# Patient Record
Sex: Female | Born: 1950 | Race: White | Hispanic: No | Marital: Single | Smoking: Never smoker
Health system: Southern US, Community
[De-identification: ages and names within clinical notes are randomized; demographics above are authoritative.]

## PROBLEM LIST (undated history)

## (undated) DIAGNOSIS — Z9889 Other specified postprocedural states: Secondary | ICD-10-CM

## (undated) DIAGNOSIS — R112 Nausea with vomiting, unspecified: Secondary | ICD-10-CM

## (undated) DIAGNOSIS — C801 Malignant (primary) neoplasm, unspecified: Secondary | ICD-10-CM

## (undated) HISTORY — PX: ABDOMINAL HYSTERECTOMY: SHX81

## (undated) HISTORY — PX: OVARIAN CYST SURGERY: SHX726

## (undated) HISTORY — DX: Nausea with vomiting, unspecified: R11.2

## (undated) HISTORY — DX: Malignant (primary) neoplasm, unspecified: C80.1

## (undated) HISTORY — PX: CHOLECYSTECTOMY: SHX55

## (undated) HISTORY — DX: Other specified postprocedural states: Z98.890

---

## 1998-01-19 ENCOUNTER — Other Ambulatory Visit: Admission: RE | Admit: 1998-01-19 | Discharge: 1998-01-19 | Payer: Self-pay | Admitting: Gynecology

## 1999-08-01 ENCOUNTER — Encounter: Payer: Self-pay | Admitting: Gynecology

## 1999-08-01 ENCOUNTER — Encounter: Admission: RE | Admit: 1999-08-01 | Discharge: 1999-08-01 | Payer: Self-pay | Admitting: Gynecology

## 2000-08-25 ENCOUNTER — Encounter: Admission: RE | Admit: 2000-08-25 | Discharge: 2000-08-25 | Payer: Self-pay | Admitting: Gynecology

## 2000-08-25 ENCOUNTER — Encounter: Payer: Self-pay | Admitting: Gynecology

## 2000-09-08 ENCOUNTER — Other Ambulatory Visit: Admission: RE | Admit: 2000-09-08 | Discharge: 2000-09-08 | Payer: Self-pay | Admitting: Gynecology

## 2001-12-10 ENCOUNTER — Encounter: Payer: Self-pay | Admitting: Gynecology

## 2001-12-10 ENCOUNTER — Encounter: Admission: RE | Admit: 2001-12-10 | Discharge: 2001-12-10 | Payer: Self-pay | Admitting: Gynecology

## 2002-12-20 ENCOUNTER — Encounter: Admission: RE | Admit: 2002-12-20 | Discharge: 2002-12-20 | Payer: Self-pay | Admitting: Gynecology

## 2002-12-20 ENCOUNTER — Encounter: Payer: Self-pay | Admitting: Gynecology

## 2002-12-22 ENCOUNTER — Other Ambulatory Visit: Admission: RE | Admit: 2002-12-22 | Discharge: 2002-12-22 | Payer: Self-pay | Admitting: Gynecology

## 2003-11-14 ENCOUNTER — Encounter: Payer: Self-pay | Admitting: Internal Medicine

## 2003-12-27 ENCOUNTER — Encounter: Admission: RE | Admit: 2003-12-27 | Discharge: 2003-12-27 | Payer: Self-pay | Admitting: Gynecology

## 2004-01-02 ENCOUNTER — Other Ambulatory Visit: Admission: RE | Admit: 2004-01-02 | Discharge: 2004-01-02 | Payer: Self-pay | Admitting: Gynecology

## 2005-01-09 ENCOUNTER — Encounter: Admission: RE | Admit: 2005-01-09 | Discharge: 2005-01-09 | Payer: Self-pay | Admitting: Gynecology

## 2005-01-15 ENCOUNTER — Other Ambulatory Visit: Admission: RE | Admit: 2005-01-15 | Discharge: 2005-01-15 | Payer: Self-pay | Admitting: Gynecology

## 2006-01-14 ENCOUNTER — Encounter: Admission: RE | Admit: 2006-01-14 | Discharge: 2006-01-14 | Payer: Self-pay | Admitting: Gynecology

## 2006-01-27 ENCOUNTER — Other Ambulatory Visit: Admission: RE | Admit: 2006-01-27 | Discharge: 2006-01-27 | Payer: Self-pay | Admitting: Gynecology

## 2007-01-21 ENCOUNTER — Encounter: Admission: RE | Admit: 2007-01-21 | Discharge: 2007-01-21 | Payer: Self-pay | Admitting: Gynecology

## 2007-01-29 ENCOUNTER — Other Ambulatory Visit: Admission: RE | Admit: 2007-01-29 | Discharge: 2007-01-29 | Payer: Self-pay | Admitting: Gynecology

## 2008-02-10 ENCOUNTER — Encounter: Admission: RE | Admit: 2008-02-10 | Discharge: 2008-02-10 | Payer: Self-pay | Admitting: Gynecology

## 2008-10-20 ENCOUNTER — Telehealth: Payer: Self-pay | Admitting: Internal Medicine

## 2008-11-22 ENCOUNTER — Ambulatory Visit: Payer: Self-pay | Admitting: Internal Medicine

## 2008-12-05 ENCOUNTER — Ambulatory Visit: Payer: Self-pay | Admitting: Internal Medicine

## 2009-02-13 ENCOUNTER — Encounter: Admission: RE | Admit: 2009-02-13 | Discharge: 2009-02-13 | Payer: Self-pay | Admitting: Gynecology

## 2010-02-20 ENCOUNTER — Encounter: Admission: RE | Admit: 2010-02-20 | Discharge: 2010-02-20 | Payer: Self-pay | Admitting: Gynecology

## 2010-11-13 NOTE — Procedures (Signed)
Summary: Colonoscopy   Colonoscopy  Procedure date:  12/05/2008  Findings:      Location:  [facility].    Procedures Next Due Date:    Colonoscopy: 12/2013  COLONOSCOPY PROCEDURE REPORT  PATIENT:  Maria Willis, Maria Willis  MR#:  [id number] BIRTHDATE:   04-Oct-1951   GENDER:   female  ENDOSCOPIST:   Hedwig Morton. Juanda Chance, MD Referred by: Beather Arbour, M.D.  PROCEDURE DATE:  12/05/2008 PROCEDURE:  Higher-risk screening colonoscopy  colonoscopy via colostomy ASA CLASS:   Class I INDICATIONS: family history of colon cancer last colon 10/2003, hx of colon cancer in pt's father  MEDICATIONS:    Versed 8 mg, Fentanyl 75 mcg  DESCRIPTION OF PROCEDURE:   After the risks benefits and alternatives of the procedure were thoroughly explained, informed consent was obtained.  Digital rectal exam was performed and revealed no rectal masses.   The LB [id number]-[id number] endoscope was introduced through the anus and advanced to the cecum, which was identified by both the appendix and ileocecal valve, without limitations.  The quality of the prep was excellent, using MiraLax.  The instrument was then slowly withdrawn as the colon was fully examined. <<PROCEDUREIMAGES>>      <<OLD IMAGES>>  FINDINGS:  A normal appearing cecum, ileocecal valve, and appendiceal orifice were identified. The ascending, transverse, descending, sigmoid colon, and rectum appeared unremarkable (see image1, image2, image3, and image4).   Retroflexed views in the rectum revealed no abnormalities.    The scope was then withdrawn from the patient and the procedure completed.  COMPLICATIONS:   None  ENDOSCOPIC IMPRESSION:  1) Normal colon  no polyps, [previously noted diverticuli not confirmed on this exam, pt has hypertrophied folds in the sigmoid colon RECOMMENDATIONS:  1) hemoccults q 2-3 years  2) high fiber diet  REPEAT EXAM:   In 5 year(s) for.   _______________________________ Hedwig Morton. Juanda Chance, MD  CC: Beather Arbour, MD

## 2010-11-13 NOTE — Miscellaneous (Signed)
Summary: [facility] Previsit/prep..  Clinical Lists Changes  Medications: Added new medication of DULCOLAX 5 MG  TBEC (BISACODYL) Day before procedure take 2 at 3pm and 2 at 8pm. - Signed Added new medication of METOCLOPRAMIDE HCL 10 MG  TABS (METOCLOPRAMIDE HCL) As per prep instructions. - Signed Added new medication of MIRALAX   POWD (POLYETHYLENE GLYCOL 3350) As per prep  instructions. - Signed Rx of DULCOLAX 5 MG  TBEC (BISACODYL) Day before procedure take 2 at 3pm and 2 at 8pm.;  #4 x 0;  Signed;  Entered by: Wyona Almas RN;  Authorized by: Hart Carwin MD;  Method used: Electronically to [facility] [address] [place], [place]  [address], Ph: [phone] or [phone], Fax: [phone] Rx of METOCLOPRAMIDE HCL 10 MG  TABS (METOCLOPRAMIDE HCL) As per prep instructions.;  #2 x 0;  Signed;  Entered by: Wyona Almas RN;  Authorized by: Hart Carwin MD;  Method used: Electronically to [facility] [address], [place], [place]  [address], Ph: [phone] or [phone], Fax: [phone] Rx of MIRALAX   POWD (POLYETHYLENE GLYCOL 3350) As per prep  instructions.;  #255gm x 0;  Signed;  Entered by: Wyona Almas RN;  Authorized by: Hart Carwin MD;  Method used: Electronically to [facility] [address], [place], [place]  [address], Ph: [phone] or [phone], Fax: [phone] Allergies: Added new allergy or adverse reaction of SULFA Observations: Added new observation of NKA: F (11/22/2008 7:57)    Prescriptions: MIRALAX   POWD (POLYETHYLENE GLYCOL 3350) As per prep  instructions.  #255gm x 0   Entered by:   Wyona Almas RN   Authorized by:   Hart Carwin MD   Signed by:   Wyona Almas RN on 11/22/2008   Method used:   Electronically to        [facility] (retail)       [address]       [place], [place]  [address]       Ph: [phone] or [phone]       Fax: [phone]   RxID:   [id number] METOCLOPRAMIDE HCL 10 MG  TABS (METOCLOPRAMIDE  HCL) As per prep instructions.  #2 x 0   Entered by:   Wyona Almas RN   Authorized by:   Hart Carwin MD   Signed by:   Wyona Almas RN on 11/22/2008   Method used:   Electronically to        [facility] (retail)       [address]       [place], [place]  [address]       Ph: [phone] or [phone]       Fax: [phone]   RxID:   [id number] DULCOLAX 5 MG  TBEC (BISACODYL) Day before procedure take 2 at 3pm and 2 at 8pm.  #4 x 0   Entered by:   Wyona Almas RN   Authorized by:   Hart Carwin MD   Signed by:   Wyona Almas RN on 11/22/2008   Method used:   Electronically to        [facility] (retail)       [address]       [place], [place]  [address]       Ph: [phone] or [phone]       Fax: [phone]   RxID:   [id number]

## 2010-11-13 NOTE — Procedures (Signed)
Summary: Colonoscopy   Colonoscopy  Procedure date:  11/14/2003  Findings:      Location:  [facility].     Patient Name: Maria Willis, Maria Willis MRN:  Procedure Procedures: Colonoscopy CPT: [phone].  Personnel: Endoscopist: Mansfield Dann L. Juanda Chance, MD.  Referred By: Beather Arbour, MD.  Exam Location: Exam performed in Outpatient Clinic. Outpatient  Patient Consent: Procedure, Alternatives, Risks and Benefits discussed, consent obtained, from patient. Consent was obtained by the RN.  Indications  Average Risk Screening Routine.  History  Current Medications: Patient is not currently taking Coumadin.  Pre-Exam Physical: Performed Nov 14, 2003. Cardio-pulmonary exam, Rectal exam, HEENT exam , Abdominal exam, Extremity exam, Neurological exam, Mental status exam WNL.  Exam Exam: Extent of exam reached: Cecum, extent intended: Cecum.  The cecum was identified by appendiceal orifice and IC valve. Colon retroflexion performed. Images taken. ASA Classification: I. Tolerance: good.  Monitoring: Pulse and BP monitoring, Oximetry used. Supplemental O2 given.  Colon Prep Used Golytely for colon prep. Prep results: good.  Sedation Meds: Patient assessed and found to be appropriate for moderate (conscious) sedation. Fentanyl 100 mcg. given IV. Versed 7 mg. given IV.  Findings - DIVERTICULOSIS: Sigmoid Colon. ICD9: Diverticulosis: 562.10. Comments: minimal, only few diverticuli noted.   Assessment Normal examination.  Diagnoses: 562.10: Diverticulosis.   Comments: no polyps Events  Unplanned Interventions: No intervention was required.  Unplanned Events: There were no complications. Plans Patient Education: Patient given standard instructions for: Yearly hemoccult testing recommended. Patient instructed to get routine colonoscopy every 10 years.  Comments: follow up with Dr Nicholas Lose Disposition: After procedure patient sent to recovery. After recovery patient  sent home.    CC: Leonette Most Lomax,MD  This report was created from the original endoscopy report, which was reviewed and signed by the above listed endoscopist.

## 2010-11-13 NOTE — Progress Notes (Signed)
Summary: Recall colonoscopy scheduled  Phone Note Call from Patient Call back at [phone]   Caller: Patient Call For: BRODIE  Reason for Call: Talk to Nurse Details for Reason: ? recall  Summary of Call: recall due in 10-2013 pt is wondering if she has to wait that long since her father was DX with colorectal CA since pt's last Colonoscopy Initial call taken by: Guadlupe Spanish [facility],  October 20, 2008 1:33 PM  Follow-up for Phone Call        Pt. father was DX. with Colon/Rectal cancer 2 years ago, he is 43.  Pt. is symptom free and she does do hemoccult cards every year w/Dr.Lomax, the are always negative. She wants to know if she should wait till her recall date in 2015 or should she have a sooner colonoscopy? DR.BRODIE PLEASE ADVISE Follow-up by: Laureen Ochs LPN,  October 20, 2008 4:07 PM  Additional Follow-up for Phone Call Additional follow up Details #1::        chart reviewed. Last colon 11/14/03, normal exam, there were no risk factors at that time. Since her father has since been diagnosed with colon Cancer, her recall should be changed to 5 years, so  she ought to have a recall colonoscopy this month 1/10 Additional Follow-up by: Hart Carwin MD,  October 20, 2008 11:40 PM  New Problems: CARCINOMA, COLON, FAMILY HX (ICD-V16.0)   Additional Follow-up for Phone Call Additional follow up Details #2::    Above MD instructions reviewed w/pt. She needs to schedule Colonoscopy for early on a Monday morning, but not 11/28/08. I will callback when the remainder of Dr.Brodie's schedule gets put in the computer, to schedule w/pt. Pt. instructed to call back as needed.  Follow-up by: Laureen Ochs LPN,  October 21, 2008 9:13 AM  Additional Follow-up for Phone Call Additional follow up Details #3:: Details for Additional Follow-up Action Taken: Message left for patient: Pre visit scheduled for 11-25-2008 at 9am and Colonoscopy is scheduled at [facility] on 12-05-08 at 8:30am. Information also  mailed to patient. Pt. advised to callback if she needs to reschedule appointments. Pt. instructed to call back as needed.  Additional Follow-up by: Laureen Ochs LPN,  October 24, 2008 2:07 PM  New Problems: CARCINOMA, COLON, FAMILY HX (ICD-V16.0)

## 2011-03-28 ENCOUNTER — Other Ambulatory Visit: Payer: Self-pay | Admitting: Gynecology

## 2011-03-28 DIAGNOSIS — Z1231 Encounter for screening mammogram for malignant neoplasm of breast: Secondary | ICD-10-CM

## 2011-04-02 ENCOUNTER — Ambulatory Visit: Payer: Self-pay

## 2011-04-10 ENCOUNTER — Ambulatory Visit
Admission: RE | Admit: 2011-04-10 | Discharge: 2011-04-10 | Disposition: A | Discharge status: Home / Self Care | Payer: PRIVATE HEALTH INSURANCE | Source: Ambulatory Visit | Attending: Gynecology | Admitting: Gynecology

## 2011-04-10 DIAGNOSIS — Z1231 Encounter for screening mammogram for malignant neoplasm of breast: Secondary | ICD-10-CM

## 2012-03-03 ENCOUNTER — Other Ambulatory Visit: Payer: Self-pay | Admitting: Gynecology

## 2012-03-03 DIAGNOSIS — Z1231 Encounter for screening mammogram for malignant neoplasm of breast: Secondary | ICD-10-CM

## 2012-04-10 ENCOUNTER — Ambulatory Visit
Admission: RE | Admit: 2012-04-10 | Discharge: 2012-04-10 | Disposition: A | Discharge status: Home / Self Care | Payer: PRIVATE HEALTH INSURANCE | Source: Ambulatory Visit | Attending: Gynecology | Admitting: Gynecology

## 2012-04-10 DIAGNOSIS — Z1231 Encounter for screening mammogram for malignant neoplasm of breast: Secondary | ICD-10-CM

## 2013-06-24 ENCOUNTER — Other Ambulatory Visit: Payer: Self-pay

## 2013-06-24 DIAGNOSIS — Z1231 Encounter for screening mammogram for malignant neoplasm of breast: Secondary | ICD-10-CM

## 2013-07-15 ENCOUNTER — Ambulatory Visit: Payer: PRIVATE HEALTH INSURANCE

## 2013-08-02 ENCOUNTER — Ambulatory Visit
Admission: RE | Admit: 2013-08-02 | Discharge: 2013-08-02 | Disposition: A | Discharge status: Home / Self Care | Payer: PRIVATE HEALTH INSURANCE | Source: Ambulatory Visit

## 2013-08-02 DIAGNOSIS — Z1231 Encounter for screening mammogram for malignant neoplasm of breast: Secondary | ICD-10-CM

## 2013-08-03 ENCOUNTER — Ambulatory Visit: Payer: PRIVATE HEALTH INSURANCE

## 2013-09-24 ENCOUNTER — Encounter: Payer: Self-pay | Admitting: Internal Medicine

## 2013-09-29 ENCOUNTER — Telehealth: Payer: Self-pay | Admitting: Internal Medicine

## 2013-09-29 NOTE — Telephone Encounter (Signed)
Left a message for patient to call me. 

## 2013-10-01 NOTE — Telephone Encounter (Signed)
Spoke with patient and told her due to her father having colon cancer, her recall is 5 years. She is worried about the cost. Discussed importance of having the procedure. Discussed possible payment plan. She will think about this.

## 2014-04-27 ENCOUNTER — Encounter: Payer: Self-pay | Admitting: Internal Medicine

## 2014-07-26 ENCOUNTER — Other Ambulatory Visit: Payer: Self-pay

## 2014-07-26 DIAGNOSIS — Z1239 Encounter for other screening for malignant neoplasm of breast: Secondary | ICD-10-CM

## 2014-08-17 ENCOUNTER — Ambulatory Visit
Admission: RE | Admit: 2014-08-17 | Discharge: 2014-08-17 | Disposition: A | Discharge status: Home / Self Care | Payer: No Typology Code available for payment source | Source: Ambulatory Visit

## 2014-08-17 ENCOUNTER — Other Ambulatory Visit: Payer: Self-pay

## 2014-08-17 DIAGNOSIS — Z1231 Encounter for screening mammogram for malignant neoplasm of breast: Secondary | ICD-10-CM

## 2015-08-14 ENCOUNTER — Other Ambulatory Visit: Payer: Self-pay

## 2015-08-14 DIAGNOSIS — Z1231 Encounter for screening mammogram for malignant neoplasm of breast: Secondary | ICD-10-CM

## 2015-08-29 ENCOUNTER — Ambulatory Visit
Admission: RE | Admit: 2015-08-29 | Discharge: 2015-08-29 | Disposition: A | Discharge status: Home / Self Care | Payer: 59 | Source: Ambulatory Visit

## 2015-08-29 DIAGNOSIS — Z1231 Encounter for screening mammogram for malignant neoplasm of breast: Secondary | ICD-10-CM

## 2016-07-29 ENCOUNTER — Other Ambulatory Visit: Payer: Self-pay | Admitting: Physician Assistant

## 2016-07-29 DIAGNOSIS — Z1231 Encounter for screening mammogram for malignant neoplasm of breast: Secondary | ICD-10-CM

## 2016-08-29 ENCOUNTER — Ambulatory Visit
Admission: RE | Admit: 2016-08-29 | Discharge: 2016-08-29 | Disposition: A | Discharge status: Home / Self Care | Payer: 59 | Source: Ambulatory Visit | Attending: Physician Assistant | Admitting: Physician Assistant

## 2016-08-29 DIAGNOSIS — Z1231 Encounter for screening mammogram for malignant neoplasm of breast: Secondary | ICD-10-CM

## 2017-01-20 DIAGNOSIS — H6123 Impacted cerumen, bilateral: Secondary | ICD-10-CM | POA: Diagnosis not present

## 2017-01-23 DIAGNOSIS — M25561 Pain in right knee: Secondary | ICD-10-CM | POA: Diagnosis not present

## 2017-01-23 DIAGNOSIS — M25521 Pain in right elbow: Secondary | ICD-10-CM | POA: Diagnosis not present

## 2017-06-13 DIAGNOSIS — L821 Other seborrheic keratosis: Secondary | ICD-10-CM | POA: Diagnosis not present

## 2017-06-13 DIAGNOSIS — Z85828 Personal history of other malignant neoplasm of skin: Secondary | ICD-10-CM | POA: Diagnosis not present

## 2017-08-21 ENCOUNTER — Other Ambulatory Visit: Payer: Self-pay | Admitting: Physician Assistant

## 2017-08-21 DIAGNOSIS — Z139 Encounter for screening, unspecified: Secondary | ICD-10-CM

## 2017-09-17 ENCOUNTER — Ambulatory Visit
Admission: RE | Admit: 2017-09-17 | Discharge: 2017-09-17 | Disposition: A | Discharge status: Home / Self Care | Payer: Medicare Other | Source: Ambulatory Visit | Attending: Physician Assistant | Admitting: Physician Assistant

## 2017-09-17 DIAGNOSIS — Z1231 Encounter for screening mammogram for malignant neoplasm of breast: Secondary | ICD-10-CM | POA: Diagnosis not present

## 2017-09-17 DIAGNOSIS — Z139 Encounter for screening, unspecified: Secondary | ICD-10-CM

## 2017-10-01 DIAGNOSIS — H6123 Impacted cerumen, bilateral: Secondary | ICD-10-CM | POA: Diagnosis not present

## 2017-10-01 DIAGNOSIS — Z23 Encounter for immunization: Secondary | ICD-10-CM | POA: Diagnosis not present

## 2017-10-14 HISTORY — PX: MENISCUS REPAIR: SHX5179

## 2017-10-30 DIAGNOSIS — S83241A Other tear of medial meniscus, current injury, right knee, initial encounter: Secondary | ICD-10-CM | POA: Diagnosis not present

## 2017-11-08 DIAGNOSIS — M25561 Pain in right knee: Secondary | ICD-10-CM | POA: Diagnosis not present

## 2017-12-10 DIAGNOSIS — G8918 Other acute postprocedural pain: Secondary | ICD-10-CM | POA: Diagnosis not present

## 2017-12-10 DIAGNOSIS — S83241A Other tear of medial meniscus, current injury, right knee, initial encounter: Secondary | ICD-10-CM | POA: Diagnosis not present

## 2017-12-10 DIAGNOSIS — M23251 Derangement of posterior horn of lateral meniscus due to old tear or injury, right knee: Secondary | ICD-10-CM | POA: Diagnosis not present

## 2017-12-10 DIAGNOSIS — M94261 Chondromalacia, right knee: Secondary | ICD-10-CM | POA: Diagnosis not present

## 2017-12-10 DIAGNOSIS — M23221 Derangement of posterior horn of medial meniscus due to old tear or injury, right knee: Secondary | ICD-10-CM | POA: Diagnosis not present

## 2017-12-10 DIAGNOSIS — S83281A Other tear of lateral meniscus, current injury, right knee, initial encounter: Secondary | ICD-10-CM | POA: Diagnosis not present

## 2017-12-16 DIAGNOSIS — S83241D Other tear of medial meniscus, current injury, right knee, subsequent encounter: Secondary | ICD-10-CM | POA: Diagnosis not present

## 2018-01-15 DIAGNOSIS — S83241D Other tear of medial meniscus, current injury, right knee, subsequent encounter: Secondary | ICD-10-CM | POA: Diagnosis not present

## 2018-01-29 DIAGNOSIS — D225 Melanocytic nevi of trunk: Secondary | ICD-10-CM | POA: Diagnosis not present

## 2018-01-29 DIAGNOSIS — L570 Actinic keratosis: Secondary | ICD-10-CM | POA: Diagnosis not present

## 2018-01-29 DIAGNOSIS — D1801 Hemangioma of skin and subcutaneous tissue: Secondary | ICD-10-CM | POA: Diagnosis not present

## 2018-01-29 DIAGNOSIS — Z85828 Personal history of other malignant neoplasm of skin: Secondary | ICD-10-CM | POA: Diagnosis not present

## 2018-01-29 DIAGNOSIS — D2271 Melanocytic nevi of right lower limb, including hip: Secondary | ICD-10-CM | POA: Diagnosis not present

## 2018-01-29 DIAGNOSIS — L821 Other seborrheic keratosis: Secondary | ICD-10-CM | POA: Diagnosis not present

## 2018-01-29 DIAGNOSIS — L918 Other hypertrophic disorders of the skin: Secondary | ICD-10-CM | POA: Diagnosis not present

## 2018-01-29 DIAGNOSIS — D2272 Melanocytic nevi of left lower limb, including hip: Secondary | ICD-10-CM | POA: Diagnosis not present

## 2018-01-29 DIAGNOSIS — L814 Other melanin hyperpigmentation: Secondary | ICD-10-CM | POA: Diagnosis not present

## 2018-03-16 DIAGNOSIS — S83241D Other tear of medial meniscus, current injury, right knee, subsequent encounter: Secondary | ICD-10-CM | POA: Diagnosis not present

## 2018-03-16 DIAGNOSIS — S83281D Other tear of lateral meniscus, current injury, right knee, subsequent encounter: Secondary | ICD-10-CM | POA: Diagnosis not present

## 2018-03-16 DIAGNOSIS — M25561 Pain in right knee: Secondary | ICD-10-CM | POA: Diagnosis not present

## 2018-03-24 DIAGNOSIS — S83241D Other tear of medial meniscus, current injury, right knee, subsequent encounter: Secondary | ICD-10-CM | POA: Diagnosis not present

## 2018-03-24 DIAGNOSIS — M25561 Pain in right knee: Secondary | ICD-10-CM | POA: Diagnosis not present

## 2018-03-24 DIAGNOSIS — S83281D Other tear of lateral meniscus, current injury, right knee, subsequent encounter: Secondary | ICD-10-CM | POA: Diagnosis not present

## 2018-04-28 DIAGNOSIS — Z1159 Encounter for screening for other viral diseases: Secondary | ICD-10-CM | POA: Diagnosis not present

## 2018-04-28 DIAGNOSIS — E7800 Pure hypercholesterolemia, unspecified: Secondary | ICD-10-CM | POA: Diagnosis not present

## 2018-04-28 DIAGNOSIS — Z0000 Encounter for general adult medical examination without abnormal findings: Secondary | ICD-10-CM | POA: Diagnosis not present

## 2018-04-28 DIAGNOSIS — Z23 Encounter for immunization: Secondary | ICD-10-CM | POA: Diagnosis not present

## 2018-04-28 DIAGNOSIS — M8588 Other specified disorders of bone density and structure, other site: Secondary | ICD-10-CM | POA: Diagnosis not present

## 2018-04-30 DIAGNOSIS — Z0000 Encounter for general adult medical examination without abnormal findings: Secondary | ICD-10-CM | POA: Diagnosis not present

## 2018-04-30 DIAGNOSIS — Z1159 Encounter for screening for other viral diseases: Secondary | ICD-10-CM | POA: Diagnosis not present

## 2018-04-30 DIAGNOSIS — E7800 Pure hypercholesterolemia, unspecified: Secondary | ICD-10-CM | POA: Diagnosis not present

## 2018-04-30 DIAGNOSIS — M8588 Other specified disorders of bone density and structure, other site: Secondary | ICD-10-CM | POA: Diagnosis not present

## 2018-05-25 DIAGNOSIS — R634 Abnormal weight loss: Secondary | ICD-10-CM | POA: Diagnosis not present

## 2018-05-25 DIAGNOSIS — Z0189 Encounter for other specified special examinations: Secondary | ICD-10-CM | POA: Diagnosis not present

## 2018-05-25 DIAGNOSIS — E7800 Pure hypercholesterolemia, unspecified: Secondary | ICD-10-CM | POA: Diagnosis not present

## 2018-05-25 DIAGNOSIS — E559 Vitamin D deficiency, unspecified: Secondary | ICD-10-CM | POA: Diagnosis not present

## 2018-05-28 DIAGNOSIS — R634 Abnormal weight loss: Secondary | ICD-10-CM | POA: Diagnosis not present

## 2018-08-09 DIAGNOSIS — Z23 Encounter for immunization: Secondary | ICD-10-CM | POA: Diagnosis not present

## 2018-08-10 ENCOUNTER — Other Ambulatory Visit: Payer: Self-pay | Admitting: Physician Assistant

## 2018-08-10 ENCOUNTER — Other Ambulatory Visit: Payer: Self-pay | Admitting: Family Medicine

## 2018-08-10 DIAGNOSIS — Z1231 Encounter for screening mammogram for malignant neoplasm of breast: Secondary | ICD-10-CM

## 2018-08-17 DIAGNOSIS — M8588 Other specified disorders of bone density and structure, other site: Secondary | ICD-10-CM | POA: Diagnosis not present

## 2018-08-31 DIAGNOSIS — J019 Acute sinusitis, unspecified: Secondary | ICD-10-CM | POA: Diagnosis not present

## 2018-08-31 DIAGNOSIS — H6123 Impacted cerumen, bilateral: Secondary | ICD-10-CM | POA: Diagnosis not present

## 2018-09-14 DIAGNOSIS — H6122 Impacted cerumen, left ear: Secondary | ICD-10-CM | POA: Diagnosis not present

## 2018-09-18 ENCOUNTER — Ambulatory Visit
Admission: RE | Admit: 2018-09-18 | Discharge: 2018-09-18 | Disposition: A | Discharge status: Home / Self Care | Payer: Medicare Other | Source: Ambulatory Visit | Attending: Family Medicine | Admitting: Family Medicine

## 2018-09-18 DIAGNOSIS — Z1231 Encounter for screening mammogram for malignant neoplasm of breast: Secondary | ICD-10-CM | POA: Diagnosis not present

## 2018-10-27 ENCOUNTER — Encounter: Payer: Self-pay | Admitting: Gastroenterology

## 2018-11-23 ENCOUNTER — Ambulatory Visit (AMBULATORY_SURGERY_CENTER): Payer: Self-pay | Admitting: *Deleted

## 2018-11-23 VITALS — Ht 64.0 in | Wt 127.0 lb

## 2018-11-23 DIAGNOSIS — Z1211 Encounter for screening for malignant neoplasm of colon: Secondary | ICD-10-CM

## 2018-11-23 MED ORDER — PEG-KCL-NACL-NASULF-NA ASC-C 140 G PO SOLR: 1.0000 | Freq: Once | ORAL | 0 refills | Status: AC

## 2018-11-23 NOTE — Progress Notes (Signed)
Patient denies any allergies to eggs or soy. Patient denies any problems with anesthesia/sedation. Patient denies any oxygen use at home. Patient denies taking any diet/weight loss medications or blood thinners. EMMI education offered, pt declined. Patient denies any family hx colon cancer.  plenvu sample given to pt.

## 2018-12-07 ENCOUNTER — Ambulatory Visit (AMBULATORY_SURGERY_CENTER): Payer: Medicare Other | Admitting: Gastroenterology

## 2018-12-07 ENCOUNTER — Encounter: Payer: Self-pay | Admitting: Gastroenterology

## 2018-12-07 VITALS — BP 107/67 | HR 73 | Temp 96.4°F | Resp 15 | Ht 64.0 in | Wt 127.0 lb

## 2018-12-07 DIAGNOSIS — Z1211 Encounter for screening for malignant neoplasm of colon: Secondary | ICD-10-CM | POA: Diagnosis not present

## 2018-12-07 MED ORDER — SODIUM CHLORIDE 0.9 % IV SOLN: 500.0000 mL | INTRAVENOUS | Status: AC

## 2018-12-07 NOTE — Op Note (Signed)
[place] Patient Name: Maria Willis Procedure Date: 12/07/2018 8:45 AM MRN: [id number] Endoscopist: Mallie Mussel L. Loletha Carrow , MD Age: 68 Referring MD:  Date of Birth: 1951/02/14 Gender: Female Account #: [id number] Procedure:                Colonoscopy Indications:              Screening for colorectal malignant neoplasm (no                            polyps 11/2008) Medicines:                Monitored Anesthesia Care Procedure:                Pre-Anesthesia Assessment:                           - Prior to the procedure, a History and Physical                            was performed, and patient medications and                            allergies were reviewed. The patient's tolerance of                            previous anesthesia was also reviewed. The risks                            and benefits of the procedure and the sedation                            options and risks were discussed with the patient.                            All questions were answered, and informed consent                            was obtained. Prior Anticoagulants: The patient has                            taken no previous anticoagulant or antiplatelet                            agents. ASA Grade Assessment: II - A patient with                            mild systemic disease. After reviewing the risks                            and benefits, the patient was deemed in                            satisfactory condition to undergo the procedure.  After obtaining informed consent, the colonoscope                            was passed under direct vision. Throughout the                            procedure, the patient's blood pressure, pulse, and                            oxygen saturations were monitored continuously. The                            Colonoscope was introduced through the anus and                            advanced to the the cecum, identified by                   appendiceal orifice and ileocecal valve. The                            colonoscopy was performed without difficulty. The                            patient tolerated the procedure well. The quality                            of the bowel preparation was excellent. The                            ileocecal valve, appendiceal orifice, and rectum                            were photographed. Scope In: 8:50:40 AM Scope Out: 9:04:55 AM Scope Withdrawal Time: 0 hours 8 minutes 43 seconds  Total Procedure Duration: 0 hours 14 minutes 15 seconds  Findings:                 The perianal and digital rectal examinations were                            normal.                           The entire examined colon appeared normal on direct                            and retroflexion views. Complications:            No immediate complications. Estimated Blood Loss:     Estimated blood loss: none. Impression:               - The entire examined colon is normal on direct and                            retroflexion views.                           -  No specimens collected. Recommendation:           - Patient has a contact number available for                            emergencies. The signs and symptoms of potential                            delayed complications were discussed with the                            patient. Return to normal activities tomorrow.                            Written discharge instructions were provided to the                            patient.                           - Resume previous diet.                           - Continue present medications.                           - No repeat screening colonoscopy due to current                            age (59 years or older) and the absence of colonic                            polyps. Maria Willis L. Loletha Carrow, MD 12/07/2018 9:07:28 AM This report has been signed electronically.

## 2018-12-07 NOTE — Progress Notes (Signed)
Pt's states no medical or surgical changes since previsit or office visit. 

## 2018-12-07 NOTE — Patient Instructions (Signed)
Impression/Recommendations:  Resume previous diet. Continue present medications.  No repeat screening colonoscopy needed.  YOU HAD AN ENDOSCOPIC PROCEDURE TODAY AT [facility]:   Refer to the procedure report that was given to you for any specific questions about what was found during the examination.  If the procedure report does not answer your questions, please call your gastroenterologist to clarify.  If you requested that your care partner not be given the details of your procedure findings, then the procedure report has been included in a sealed envelope for you to review at your convenience later.  YOU SHOULD EXPECT: Some feelings of bloating in the abdomen. Passage of more gas than usual.  Walking can help get rid of the air that was put into your GI tract during the procedure and reduce the bloating. If you had a lower endoscopy (such as a colonoscopy or flexible sigmoidoscopy) you may notice spotting of blood in your stool or on the toilet paper. If you underwent a bowel prep for your procedure, you may not have a normal bowel movement for a few days.  Please Note:  You might notice some irritation and congestion in your nose or some drainage.  This is from the oxygen used during your procedure.  There is no need for concern and it should clear up in a day or so.  SYMPTOMS TO REPORT IMMEDIATELY:   Following lower endoscopy (colonoscopy or flexible sigmoidoscopy):  Excessive amounts of blood in the stool  Significant tenderness or worsening of abdominal pains  Swelling of the abdomen that is new, acute  Fever of 100F or higher  For urgent or emergent issues, a gastroenterologist can be reached at any hour by calling [phone].   DIET:  We do recommend a small meal at first, but then you may proceed to your regular diet.  Drink plenty of fluids but you should avoid alcoholic beverages for 24 hours.  ACTIVITY:  You should plan to take it easy for the rest of  today and you should NOT DRIVE or use heavy machinery until tomorrow (because of the sedation medicines used during the test).    FOLLOW UP: Our staff will call the number listed on your records the next business day following your procedure to check on you and address any questions or concerns that you may have regarding the information given to you following your procedure. If we do not reach you, we will leave a message.  However, if you are feeling well and you are not experiencing any problems, there is no need to return our call.  We will assume that you have returned to your regular daily activities without incident.  If any biopsies were taken you will be contacted by phone or by letter within the next 1-3 weeks.  Please call us at [phone] if you have not heard about the biopsies in 3 weeks.    SIGNATURES/CONFIDENTIALITY: You and/or your care partner have signed paperwork which will be entered into your electronic medical record.  These signatures attest to the fact that that the information above on your After Visit Summary has been reviewed and is understood.  Full responsibility of the confidentiality of this discharge information lies with you and/or your care-partner.

## 2018-12-07 NOTE — Progress Notes (Signed)
Report given to PACU, vss 

## 2018-12-08 ENCOUNTER — Telehealth: Payer: Self-pay

## 2018-12-08 NOTE — Telephone Encounter (Signed)
  Follow up Call-  Call back number 12/07/2018  Post procedure Call Back phone  # [phone]  Permission to leave phone message Yes  Some recent data might be hidden     Patient questions:  Do you have a fever, pain , or abdominal swelling? No. Pain Score  0 *  Have you tolerated food without any problems? Yes.    Have you been able to return to your normal activities? Yes.    Do you have any questions about your discharge instructions: Diet   No. Medications  No. Follow up visit  No.  Do you have questions or concerns about your Care? No.  Actions: * If pain score is 4 or above: No action needed, pain <4.

## 2019-01-10 DIAGNOSIS — H6123 Impacted cerumen, bilateral: Secondary | ICD-10-CM | POA: Diagnosis not present

## 2019-03-09 DIAGNOSIS — Z20828 Contact with and (suspected) exposure to other viral communicable diseases: Secondary | ICD-10-CM | POA: Diagnosis not present

## 2019-04-06 DIAGNOSIS — L918 Other hypertrophic disorders of the skin: Secondary | ICD-10-CM | POA: Diagnosis not present

## 2019-04-06 DIAGNOSIS — L239 Allergic contact dermatitis, unspecified cause: Secondary | ICD-10-CM | POA: Diagnosis not present

## 2019-04-06 DIAGNOSIS — D692 Other nonthrombocytopenic purpura: Secondary | ICD-10-CM | POA: Diagnosis not present

## 2019-04-06 DIAGNOSIS — D2272 Melanocytic nevi of left lower limb, including hip: Secondary | ICD-10-CM | POA: Diagnosis not present

## 2019-04-06 DIAGNOSIS — D2261 Melanocytic nevi of right upper limb, including shoulder: Secondary | ICD-10-CM | POA: Diagnosis not present

## 2019-04-06 DIAGNOSIS — D485 Neoplasm of uncertain behavior of skin: Secondary | ICD-10-CM | POA: Diagnosis not present

## 2019-04-06 DIAGNOSIS — L814 Other melanin hyperpigmentation: Secondary | ICD-10-CM | POA: Diagnosis not present

## 2019-04-06 DIAGNOSIS — D225 Melanocytic nevi of trunk: Secondary | ICD-10-CM | POA: Diagnosis not present

## 2019-04-06 DIAGNOSIS — L821 Other seborrheic keratosis: Secondary | ICD-10-CM | POA: Diagnosis not present

## 2019-04-06 DIAGNOSIS — D1801 Hemangioma of skin and subcutaneous tissue: Secondary | ICD-10-CM | POA: Diagnosis not present

## 2019-04-06 DIAGNOSIS — L570 Actinic keratosis: Secondary | ICD-10-CM | POA: Diagnosis not present

## 2019-04-06 DIAGNOSIS — Z85828 Personal history of other malignant neoplasm of skin: Secondary | ICD-10-CM | POA: Diagnosis not present

## 2019-06-29 DIAGNOSIS — L570 Actinic keratosis: Secondary | ICD-10-CM | POA: Diagnosis not present

## 2019-06-29 DIAGNOSIS — Z85828 Personal history of other malignant neoplasm of skin: Secondary | ICD-10-CM | POA: Diagnosis not present

## 2019-06-29 DIAGNOSIS — C44519 Basal cell carcinoma of skin of other part of trunk: Secondary | ICD-10-CM | POA: Diagnosis not present

## 2019-06-29 DIAGNOSIS — D485 Neoplasm of uncertain behavior of skin: Secondary | ICD-10-CM | POA: Diagnosis not present

## 2019-06-29 DIAGNOSIS — L821 Other seborrheic keratosis: Secondary | ICD-10-CM | POA: Diagnosis not present

## 2019-06-29 DIAGNOSIS — D1801 Hemangioma of skin and subcutaneous tissue: Secondary | ICD-10-CM | POA: Diagnosis not present

## 2019-06-29 DIAGNOSIS — L820 Inflamed seborrheic keratosis: Secondary | ICD-10-CM | POA: Diagnosis not present

## 2019-07-12 DIAGNOSIS — H6123 Impacted cerumen, bilateral: Secondary | ICD-10-CM | POA: Diagnosis not present

## 2019-07-12 DIAGNOSIS — Z23 Encounter for immunization: Secondary | ICD-10-CM | POA: Diagnosis not present

## 2019-07-19 DIAGNOSIS — E7800 Pure hypercholesterolemia, unspecified: Secondary | ICD-10-CM | POA: Diagnosis not present

## 2019-07-19 DIAGNOSIS — Z23 Encounter for immunization: Secondary | ICD-10-CM | POA: Diagnosis not present

## 2019-07-19 DIAGNOSIS — Z131 Encounter for screening for diabetes mellitus: Secondary | ICD-10-CM | POA: Diagnosis not present

## 2019-07-19 DIAGNOSIS — Z0000 Encounter for general adult medical examination without abnormal findings: Secondary | ICD-10-CM | POA: Diagnosis not present

## 2019-08-27 ENCOUNTER — Other Ambulatory Visit: Payer: Self-pay | Admitting: Family Medicine

## 2019-08-27 DIAGNOSIS — Z1231 Encounter for screening mammogram for malignant neoplasm of breast: Secondary | ICD-10-CM

## 2019-10-21 ENCOUNTER — Ambulatory Visit
Admission: RE | Admit: 2019-10-21 | Discharge: 2019-10-21 | Disposition: A | Discharge status: Home / Self Care | Payer: Medicare Other | Source: Ambulatory Visit | Attending: Family Medicine | Admitting: Family Medicine

## 2019-10-21 ENCOUNTER — Other Ambulatory Visit: Payer: Self-pay

## 2019-10-21 DIAGNOSIS — Z1231 Encounter for screening mammogram for malignant neoplasm of breast: Secondary | ICD-10-CM

## 2019-11-08 DIAGNOSIS — H52203 Unspecified astigmatism, bilateral: Secondary | ICD-10-CM | POA: Diagnosis not present

## 2019-11-08 DIAGNOSIS — H2513 Age-related nuclear cataract, bilateral: Secondary | ICD-10-CM | POA: Diagnosis not present

## 2019-11-26 DIAGNOSIS — Z23 Encounter for immunization: Secondary | ICD-10-CM | POA: Diagnosis not present

## 2019-12-24 DIAGNOSIS — Z23 Encounter for immunization: Secondary | ICD-10-CM | POA: Diagnosis not present

## 2020-06-29 DIAGNOSIS — D225 Melanocytic nevi of trunk: Secondary | ICD-10-CM | POA: Diagnosis not present

## 2020-06-29 DIAGNOSIS — L570 Actinic keratosis: Secondary | ICD-10-CM | POA: Diagnosis not present

## 2020-06-29 DIAGNOSIS — D1801 Hemangioma of skin and subcutaneous tissue: Secondary | ICD-10-CM | POA: Diagnosis not present

## 2020-06-29 DIAGNOSIS — D485 Neoplasm of uncertain behavior of skin: Secondary | ICD-10-CM | POA: Diagnosis not present

## 2020-06-29 DIAGNOSIS — D2272 Melanocytic nevi of left lower limb, including hip: Secondary | ICD-10-CM | POA: Diagnosis not present

## 2020-06-29 DIAGNOSIS — L821 Other seborrheic keratosis: Secondary | ICD-10-CM | POA: Diagnosis not present

## 2020-06-29 DIAGNOSIS — L237 Allergic contact dermatitis due to plants, except food: Secondary | ICD-10-CM | POA: Diagnosis not present

## 2020-06-29 DIAGNOSIS — L814 Other melanin hyperpigmentation: Secondary | ICD-10-CM | POA: Diagnosis not present

## 2020-06-29 DIAGNOSIS — Z85828 Personal history of other malignant neoplasm of skin: Secondary | ICD-10-CM | POA: Diagnosis not present

## 2020-06-29 DIAGNOSIS — C44311 Basal cell carcinoma of skin of nose: Secondary | ICD-10-CM | POA: Diagnosis not present

## 2020-06-29 DIAGNOSIS — D2262 Melanocytic nevi of left upper limb, including shoulder: Secondary | ICD-10-CM | POA: Diagnosis not present

## 2020-06-29 DIAGNOSIS — D2261 Melanocytic nevi of right upper limb, including shoulder: Secondary | ICD-10-CM | POA: Diagnosis not present

## 2020-07-06 ENCOUNTER — Encounter: Payer: Self-pay | Admitting: Gastroenterology

## 2020-07-15 DIAGNOSIS — H6123 Impacted cerumen, bilateral: Secondary | ICD-10-CM | POA: Diagnosis not present

## 2020-07-15 DIAGNOSIS — Z23 Encounter for immunization: Secondary | ICD-10-CM | POA: Diagnosis not present

## 2020-07-25 ENCOUNTER — Other Ambulatory Visit: Payer: Self-pay | Admitting: Family Medicine

## 2020-07-25 DIAGNOSIS — M8580 Other specified disorders of bone density and structure, unspecified site: Secondary | ICD-10-CM | POA: Diagnosis not present

## 2020-07-25 DIAGNOSIS — Z1231 Encounter for screening mammogram for malignant neoplasm of breast: Secondary | ICD-10-CM

## 2020-07-25 DIAGNOSIS — E559 Vitamin D deficiency, unspecified: Secondary | ICD-10-CM | POA: Diagnosis not present

## 2020-07-25 DIAGNOSIS — Z0000 Encounter for general adult medical examination without abnormal findings: Secondary | ICD-10-CM | POA: Diagnosis not present

## 2020-07-25 DIAGNOSIS — R7989 Other specified abnormal findings of blood chemistry: Secondary | ICD-10-CM | POA: Diagnosis not present

## 2020-07-25 DIAGNOSIS — E7800 Pure hypercholesterolemia, unspecified: Secondary | ICD-10-CM | POA: Diagnosis not present

## 2020-07-25 DIAGNOSIS — R519 Headache, unspecified: Secondary | ICD-10-CM | POA: Diagnosis not present

## 2020-07-31 DIAGNOSIS — Z85828 Personal history of other malignant neoplasm of skin: Secondary | ICD-10-CM | POA: Diagnosis not present

## 2020-07-31 DIAGNOSIS — C44319 Basal cell carcinoma of skin of other parts of face: Secondary | ICD-10-CM | POA: Diagnosis not present

## 2020-08-21 DIAGNOSIS — Z23 Encounter for immunization: Secondary | ICD-10-CM | POA: Diagnosis not present

## 2020-08-29 ENCOUNTER — Ambulatory Visit: Payer: Medicare Other | Admitting: Gastroenterology

## 2020-09-25 ENCOUNTER — Ambulatory Visit: Payer: Medicare Other | Admitting: Gastroenterology

## 2020-10-24 ENCOUNTER — Other Ambulatory Visit: Payer: Self-pay

## 2020-10-24 ENCOUNTER — Ambulatory Visit
Admission: RE | Admit: 2020-10-24 | Discharge: 2020-10-24 | Disposition: A | Discharge status: Home / Self Care | Payer: Medicare Other | Source: Ambulatory Visit | Attending: Family Medicine | Admitting: Family Medicine

## 2020-10-24 DIAGNOSIS — Z1231 Encounter for screening mammogram for malignant neoplasm of breast: Secondary | ICD-10-CM

## 2020-10-27 DIAGNOSIS — R21 Rash and other nonspecific skin eruption: Secondary | ICD-10-CM | POA: Diagnosis not present

## 2020-10-27 DIAGNOSIS — Z03818 Encounter for observation for suspected exposure to other biological agents ruled out: Secondary | ICD-10-CM | POA: Diagnosis not present

## 2020-10-27 DIAGNOSIS — Z20822 Contact with and (suspected) exposure to covid-19: Secondary | ICD-10-CM | POA: Diagnosis not present

## 2020-11-14 DIAGNOSIS — H02834 Dermatochalasis of left upper eyelid: Secondary | ICD-10-CM | POA: Diagnosis not present

## 2020-11-14 DIAGNOSIS — H02831 Dermatochalasis of right upper eyelid: Secondary | ICD-10-CM | POA: Diagnosis not present

## 2020-11-14 DIAGNOSIS — H52203 Unspecified astigmatism, bilateral: Secondary | ICD-10-CM | POA: Diagnosis not present

## 2020-11-14 DIAGNOSIS — H524 Presbyopia: Secondary | ICD-10-CM | POA: Diagnosis not present

## 2020-11-14 DIAGNOSIS — R21 Rash and other nonspecific skin eruption: Secondary | ICD-10-CM | POA: Diagnosis not present

## 2020-11-17 DIAGNOSIS — Z85828 Personal history of other malignant neoplasm of skin: Secondary | ICD-10-CM | POA: Diagnosis not present

## 2020-11-17 DIAGNOSIS — L239 Allergic contact dermatitis, unspecified cause: Secondary | ICD-10-CM | POA: Diagnosis not present

## 2020-11-20 DIAGNOSIS — R102 Pelvic and perineal pain: Secondary | ICD-10-CM | POA: Diagnosis not present

## 2020-11-20 DIAGNOSIS — R110 Nausea: Secondary | ICD-10-CM | POA: Diagnosis not present

## 2020-11-21 ENCOUNTER — Other Ambulatory Visit: Payer: Self-pay | Admitting: Family Medicine

## 2020-11-21 DIAGNOSIS — R102 Pelvic and perineal pain: Secondary | ICD-10-CM

## 2020-11-22 ENCOUNTER — Telehealth: Payer: Self-pay | Admitting: Gastroenterology

## 2020-11-22 NOTE — Telephone Encounter (Signed)
Pt's daughter in law is requesting a call back from a nurse, caller states the pain has increased since 12pm today and is worried about what to do for the pt, caller states it may not be smart to wait another day.

## 2020-11-22 NOTE — Telephone Encounter (Signed)
Spoke with patient, she states pain started Friday night, last BM today, prior to pain she hadn't had a BM in 2 days which is not normal for her, she reports sharp LLQ pain, she states that the pain was so bad on Sunday she called 911, they came out to assess her and stated that they did not think she was having a blockage. Patient states that she saw her PCP on Monday and they prescribed Tramadol for the pain and scheduled her for an [place] next week. Patient states that "evedrything is coming out now", she describes stool as soft. Patient has been advised to come in for a follow up with Dr. Loletha Carrow, she is scheduled for 11/23/20 at 1:20 PM. Patient concerned about taking Tramadol until she knows for sure that she does not have a blockage, advised patient to take Tylenol until appt, advised that after further assessment Dr. Loletha Carrow will advise on his recommendations. Patient verbalized understanding of all information and had no further concerns at the end of the call. Kennon Portela, CMA is aware that patient has been added.

## 2020-11-22 NOTE — Telephone Encounter (Signed)
Pt is requesting a call back from a nurse to discuss her severe abdominal pain which started last Friday night. Pt would like some advice as to what she can do.

## 2020-11-22 NOTE — Telephone Encounter (Signed)
Spoke with patient, she wanted to know if it is safe to wait until her appt tomorrow. She wanted to know when it is an emergency, advised patient that she will need to go the ED or call 911 if she is unable to tolerate anything PO or if her pain becomes a 10/10 pain level. She is aware that wait times at the ED are quite long and patient states that she would rather not have to go to the ED. Pt states that the Tramadol and Tylenol took the edge off of the pain a little. Pt advised that she stay hydrated with Gatorade, Pedialyte and water since she is not really tolerating solid foods. Patient verbalized understanding of all information and had no concerns at the end of the call.

## 2020-11-23 ENCOUNTER — Other Ambulatory Visit: Payer: Self-pay

## 2020-11-23 ENCOUNTER — Ambulatory Visit (INDEPENDENT_AMBULATORY_CARE_PROVIDER_SITE_OTHER): Payer: Medicare Other | Admitting: Gastroenterology

## 2020-11-23 ENCOUNTER — Encounter: Payer: Self-pay | Admitting: Gastroenterology

## 2020-11-23 ENCOUNTER — Other Ambulatory Visit (INDEPENDENT_AMBULATORY_CARE_PROVIDER_SITE_OTHER): Payer: Medicare Other

## 2020-11-23 ENCOUNTER — Ambulatory Visit (HOSPITAL_COMMUNITY)
Admission: RE | Admit: 2020-11-23 | Discharge: 2020-11-23 | Disposition: A | Discharge status: Home / Self Care | Payer: Medicare Other | Source: Ambulatory Visit | Attending: Gastroenterology | Admitting: Gastroenterology

## 2020-11-23 VITALS — BP 120/72 | HR 97 | Ht 64.0 in | Wt 125.4 lb

## 2020-11-23 DIAGNOSIS — R109 Unspecified abdominal pain: Secondary | ICD-10-CM

## 2020-11-23 DIAGNOSIS — R110 Nausea: Secondary | ICD-10-CM | POA: Diagnosis not present

## 2020-11-23 LAB — COMPREHENSIVE METABOLIC PANEL
ALT: 22 U/L (ref 0–35)
AST: 25 U/L (ref 0–37)
Albumin: 4.4 g/dL (ref 3.5–5.2)
Alkaline Phosphatase: 71 U/L (ref 39–117)
BUN: 14 mg/dL (ref 6–23)
CO2: 29 mEq/L (ref 19–32)
Calcium: 9.4 mg/dL (ref 8.4–10.5)
Chloride: 101 mEq/L (ref 96–112)
Creatinine, Ser: 0.78 mg/dL (ref 0.40–1.20)
GFR: 76.59 mL/min (ref >60.00)
Glucose, Bld: 91 mg/dL (ref 70–99)
Potassium: 3.4 mEq/L — ABNORMAL LOW (ref 3.5–5.1)
Sodium: 141 mEq/L (ref 135–145)
Total Bilirubin: 2.3 mg/dL — ABNORMAL HIGH (ref 0.2–1.2)
Total Protein: 7.1 g/dL (ref 6.0–8.3)

## 2020-11-23 LAB — CBC WITH DIFFERENTIAL/PLATELET
Basophils Absolute: 0 10*3/uL (ref 0.0–0.1)
Basophils Relative: 0.3 % (ref 0.0–3.0)
Eosinophils Absolute: 0 10*3/uL (ref 0.0–0.7)
Eosinophils Relative: 0.2 % (ref 0.0–5.0)
HCT: 38.3 % (ref 36.0–46.0)
Hemoglobin: 13.2 g/dL (ref 12.0–15.0)
Lymphocytes Relative: 13.1 % (ref 12.0–46.0)
Lymphs Abs: 1.1 10*3/uL (ref 0.7–4.0)
MCHC: 34.5 g/dL (ref 30.0–36.0)
MCV: 94.2 fl (ref 78.0–100.0)
Monocytes Absolute: 0.7 10*3/uL (ref 0.1–1.0)
Monocytes Relative: 8.3 % (ref 3.0–12.0)
Neutro Abs: 6.5 10*3/uL (ref 1.4–7.7)
Neutrophils Relative %: 78.1 % — ABNORMAL HIGH (ref 43.0–77.0)
Platelets: 322 10*3/uL (ref 150.0–400.0)
RBC: 4.07 Mil/uL (ref 3.87–5.11)
RDW: 12.9 % (ref 11.5–15.5)
WBC: 8.3 10*3/uL (ref 4.0–10.5)

## 2020-11-23 MED ORDER — OXYCODONE-ACETAMINOPHEN 5-325 MG PO TABS: 1.0000 | ORAL_TABLET | Freq: Four times a day (QID) | ORAL | 0 refills | Status: AC | PRN

## 2020-11-23 MED ORDER — IOHEXOL 300 MG/ML  SOLN
100.0000 mL | Freq: Once | INTRAMUSCULAR | Status: AC | PRN
  Administered 2020-11-23: 100 mL via INTRAVENOUS

## 2020-11-23 NOTE — Progress Notes (Addendum)
[facility] Progress Note  Chief Complaint: Left lower quadrant abdominal pain  Subjective  History: Maria Willis called our office yesterday with abdominal pain that had started 5 days prior and was ongoing. She had not had a bowel movement for about 2 days before the pain began. She saw primary care 3 days ago and was prescribed some tramadol and scheduled for an abdominal ultrasound.  Maria Willis was seen as an urgent add-on today for the symptoms.  It began 5 days ago and she had not had a bowel movement for about 2 days, which is unusual for her.  The pain has been episodically at a 9 out of 10 including a few days back when it was so bad she called EMS.  She tells me that they arrived, evaluated her and told her that they did not think she was having an obstruction and that she probably would sit in the ER for at least a day before being seen, so she opted not to go.  The pain has continued, its minimally relieved with tramadol.  Her bowels are moving for the last several days, though somewhat soft in consistency.  No blood in the stool.  When the pain gets severe she has nausea and vomiting.  Maria Willis has not noticed a fever or chills at home. Normal screening colonoscopy with me February 2020. No polyps on colonoscopy by Dr. Olevia Perches February 2010  ROS: Cardiovascular:  no chest pain Respiratory: no dyspnea Remainder of systems negative except as above The patient's Past Medical, Family and Social History were reviewed and are on file in the EMR. Past Medical History:  Diagnosis Date  . Cancer ([place])    skin cancer   . Post-operative nausea and vomiting    "nausea and hard to wake up" per patient   Past Surgical History:  Procedure Laterality Date  . ABDOMINAL HYSTERECTOMY    . CHOLECYSTECTOMY    . MENISCUS REPAIR  2019  . OVARIAN CYST SURGERY     She had her right ovary removed decades ago for a complicated cyst.   Family History  Problem Relation Age of Onset  . Colon polyps Mother    . Colon cancer Neg Hx   . Esophageal cancer Neg Hx   . Rectal cancer Neg Hx   . Stomach cancer Neg Hx     Objective:  Med list reviewed  Current Outpatient Medications:  .  CALCIUM PO, Take by mouth., Disp: , Rfl:  .  Cholecalciferol (VITAMIN D3 PO), Take 5,000 Units by mouth daily., Disp: , Rfl:  .  LECITHIN CONCENTRATE PO, Take 1 tablet by mouth daily., Disp: , Rfl:  .  NON FORMULARY, Take 1 tablet by mouth daily. Glycoberine, Disp: , Rfl:  .  NON FORMULARY, Take 1 tablet by mouth daily. CholerReqII, Disp: , Rfl:  .  Nutritional Supplements (CHOLESTEROL DEFENSE PO), Take 1 tablet by mouth daily., Disp: , Rfl:  .  traMADol (ULTRAM) 50 MG tablet, Take 50 mg by mouth as needed., Disp: , Rfl:   Current Facility-Administered Medications:  .  0.9 %  sodium chloride infusion, 500 mL, Intravenous, Continuous, Danis, Estill Cotta III, MD   Vital signs in last 24 hrs: Vitals:   11/23/20 1317  BP: 120/72  Pulse: 97  SpO2: 97%   Wt Readings from Last 3 Encounters:  11/23/20 125 lb 6.4 oz (56.9 kg)  12/07/18 127 lb (57.6 kg)  11/23/18 127 lb (57.6 kg)    Physical Exam  Fatigued, nontoxic.  Pulse in the 90s.  Looks well-hydrated  HEENT: sclera anicteric, oral mucosa moist without lesions  Neck: supple, no thyromegaly, JVD or lymphadenopathy  Cardiac: RRR without murmurs, S1S2 heard, no peripheral edema  Pulm: clear to auscultation bilaterally, normal RR and effort noted  Abdomen: soft, mild LLQ tenderness to deep palpation, with active bowel sounds. No guarding or palpable hepatosplenomegaly.  Pain not worsened with movement or leg/hip mobilization  Skin; warm and dry, no jaundice or rash Rectal:  Nontender, soft, heme negative stool Labs:  CBC Latest Ref Rng & Units 11/23/2020  WBC 4.0 - 10.5 K/uL 8.3  Hemoglobin 12.0 - 15.0 g/dL 13.2  Hematocrit 36.0 - 46.0 % 38.3  Platelets 150.0 - 400.0 K/uL 322.0   CMP Latest Ref Rng & Units 11/23/2020  Glucose 70 - 99 mg/dL 91  BUN 6  - 23 mg/dL 14  Creatinine 0.40 - 1.20 mg/dL 0.78  Sodium 135 - 145 mEq/L 141  Potassium 3.5 - 5.1 mEq/L 3.4(L)  Chloride 96 - 112 mEq/L 101  CO2 19 - 32 mEq/L 29  Calcium 8.4 - 10.5 mg/dL 9.4  Total Protein 6.0 - 8.3 g/dL 7.1  Total Bilirubin 0.2 - 1.2 mg/dL 2.3(H)  Alkaline Phos 39 - 117 U/L 71  AST 0 - 37 U/L 25  ALT 0 - 35 U/L 22    ___________________________________________ Radiologic studies:   ____________________________________________ Other:   _____________________________________________ Assessment & Plan  Assessment: Encounter Diagnoses  Name Primary?  . Abdominal pain, unspecified abdominal location Yes  . Nausea in adult    5 days of acute onset left lower quadrant pain of unclear cause.  She is describing 9 out of 10 pain, though exam fairly benign.  No diverticuli seen on last colonoscopy, but they may not always be seen on scope and thus diverticulitis is a possibility.  She is certainly not had any history of that.  Previous right-sided ovarian cyst requiring surgery, but seems unlikely to be the case on the left side at this point since she is postmenopausal. Exam not consistent with obstruction, and she is having bowel movements over the last several days.  She went for stat labs after the clinic visit and results are as above.  Reassuring that there is no leukocytosis.  Potassium mildly decreased, probably from decreased oral intake in the last several days.  Plan: She is going for a CT abdomen and pelvis with oral and IV contrast this evening.  I spoke to our on-call physician in case they should hear about any urgent findings from radiology. I will otherwise follow-up on it tomorrow.  I asked Maria Willis to stop taking the tramadol since it does not seem to be working well and I do not want to affect her renal function.  I gave her 15 tablets of Percocet, and asked her to start with a half a tablet given her age and because she says she is sensitive to  medicines.   40 minutes were spent on this encounter (including chart review, history/exam, counseling/coordination of care, and documentation) > 50% of that time was spent on counseling and coordination of care.  Topics discussed included: Acute abdominal pain, possible causes and work-up.  Nelida Meuse III

## 2020-11-23 NOTE — Patient Instructions (Signed)
If you are age 71 or older, your body mass index should be between 23-30. Your Body mass index is 21.52 kg/m. If this is out of the aforementioned range listed, please consider follow up with your Primary Care Provider.  If you are age 22 or younger, your body mass index should be between 19-25. Your Body mass index is 21.52 kg/m. If this is out of the aformentioned range listed, please consider follow up with your Primary Care Provider.   Due to recent changes in healthcare laws, you may see the results of your imaging and laboratory studies on MyChart before your provider has had a chance to review them.  We understand that in some cases there may be results that are confusing or concerning to you. Not all laboratory results come back in the same time frame and the provider may be waiting for multiple results in order to interpret others.  Please give [place] 48 hours in order for your provider to thoroughly review all the results before contacting the office for clarification of your results.   Your provider has requested that you go to the basement level for lab work before leaving today. Press "B" on the elevator. The lab is located at the first door on the left as you exit the elevator.  You have been scheduled for a CT scan of the abdomen and pelvis at [facility], 1st floor Radiology. You are scheduled on 11-23-2020  at 630pm. You should arrive 15 minutes prior to your appointment time for registration.  Please pick up 2 bottles of contrast from [place] at least 3 days prior to your scan. The solution may taste better if refrigerated, but do NOT add ice or any other liquid to this solution. Shake well before drinking.   Please follow the written instructions below on the day of your exam:   1) Do not eat anything after 230pm (4 hours prior to your test)   2) Drink 1 bottle of contrast '@430pm'   (2 hours prior to your exam)  Remember to shake well before drinking and do NOT pour over  ice.     Drink 1 bottle of contrast @ 530pm (1 hour prior to your exam)   You may take any medications as prescribed with a small amount of water, if necessary. If you take any of the following medications: METFORMIN, GLUCOPHAGE, GLUCOVANCE, AVANDAMET, RIOMET, FORTAMET, [place] MET, JANUMET, GLUMETZA or METAGLIP, you MAY be asked to HOLD this medication 48 hours AFTER the exam.   The purpose of you drinking the oral contrast is to aid in the visualization of your intestinal tract. The contrast solution may cause some diarrhea. Depending on your individual set of symptoms, you may also receive an intravenous injection of x-ray contrast/dye. Plan on being at [facility] for 45 minutes or longer, depending on the type of exam you are having performed.   If you have any questions regarding your exam or if you need to reschedule, you may call [facility] at [phone] between the hours of 8:00 am and 5:00 pm, Monday-Friday.

## 2020-11-24 ENCOUNTER — Telehealth: Payer: Self-pay | Admitting: Gastroenterology

## 2020-11-24 NOTE — Telephone Encounter (Signed)
Thank you for the note  I saw the report earlier this morning, have conveyed the results to this patient and already arranged for her to see Dr. Dutch Gray at [facility] early next week.  - HD

## 2020-11-24 NOTE — Telephone Encounter (Signed)
Inbound call from [place] with [facility] requesting a call back from a nurse please in regards to a call report they have for the CT scan.

## 2020-11-24 NOTE — Telephone Encounter (Signed)
Received call report from radiology from CT scan.  IMPRESSION: 1. Obstructing 4 mm distal left ureteral calculus, with mild left hydroureter.

## 2020-11-27 ENCOUNTER — Other Ambulatory Visit: Payer: Self-pay | Admitting: Urology

## 2020-11-27 DIAGNOSIS — N201 Calculus of ureter: Secondary | ICD-10-CM | POA: Diagnosis not present

## 2020-11-28 ENCOUNTER — Other Ambulatory Visit (HOSPITAL_COMMUNITY)
Admission: RE | Admit: 2020-11-28 | Discharge: 2020-11-28 | Disposition: A | Discharge status: Home / Self Care | Payer: Medicare Other | Source: Ambulatory Visit | Attending: Urology | Admitting: Urology

## 2020-11-28 DIAGNOSIS — Z20822 Contact with and (suspected) exposure to covid-19: Secondary | ICD-10-CM | POA: Insufficient documentation

## 2020-11-28 DIAGNOSIS — Z01812 Encounter for preprocedural laboratory examination: Secondary | ICD-10-CM | POA: Diagnosis not present

## 2020-11-28 LAB — SARS CORONAVIRUS 2 (TAT 6-24 HRS): SARS Coronavirus 2: NEGATIVE

## 2020-11-28 NOTE — Patient Instructions (Signed)
DUE TO COVID-19 ONLY ONE VISITOR IS ALLOWED TO COME WITH YOU AND STAY IN THE WAITING ROOM ONLY DURING PRE OP AND PROCEDURE DAY OF SURGERY. THE 1 VISITOR  MAY VISIT WITH YOU AFTER SURGERY IN YOUR PRIVATE ROOM DURING VISITING HOURS ONLY!  YOU NEED TO HAVE A COVID 19 TEST ON: 11/28/20 @ 3:00 PM, THIS TEST MUST BE DONE BEFORE SURGERY,  COVID TESTING SITE [place] [address], IT IS ON THE RIGHT GOING OUT [address] APPROXIMATELY  2 MINUTES PAST [employer] ON THE RIGHT. ONCE YOUR COVID TEST IS COMPLETED,  PLEASE BEGIN THE QUARANTINE INSTRUCTIONS AS OUTLINED IN YOUR HANDOUT.                Maria Willis    Your procedure is scheduled on: 11/30/20   Report to [facility] Main  Entrance   Report to admitting at; 10:00 AM     Call this number if you have problems the morning of surgery [phone]    Remember: Do not eat solid food :After Midnight. Clear liquids until: 9:00 am.  CLEAR LIQUID DIET  Foods Allowed                                                                     Foods Excluded  Coffee and tea, regular and decaf                             liquids that you cannot  Plain Jell-O any favor except red or purple                                           see through such as: Fruit ices (not with fruit pulp)                                     milk, soups, orange juice  Iced Popsicles                                    All solid food Carbonated beverages, regular and diet                                    Cranberry, grape and apple juices Sports drinks like Gatorade Lightly seasoned clear broth or consume(fat free) Sugar, honey syrup  Sample Menu Breakfast                                Lunch                                     Supper Cranberry juice  Beef broth                            Chicken broth Jell-O                                     Grape juice                           Apple juice Coffee or tea                         Jell-O                                      Popsicle                                                Coffee or tea                        Coffee or tea  _____________________________________________________________________  BRUSH YOUR TEETH MORNING OF SURGERY AND RINSE YOUR MOUTH OUT, NO CHEWING GUM CANDY OR MINTS.                               You may not have any metal on your body including hair pins and              piercings  Do not wear jewelry, make-up, lotions, powders or perfumes, deodorant             Do not wear nail polish on your fingernails.  Do not shave  48 hours prior to surgery.    Do not bring valuables to the hospital. [place].  Contacts, dentures or bridgework may not be worn into surgery.  Leave suitcase in the car. After surgery it may be brought to your room.     Patients discharged the day of surgery will not be allowed to drive home. IF YOU ARE HAVING SURGERY AND GOING HOME THE SAME DAY, YOU MUST HAVE AN ADULT TO DRIVE YOU HOME AND BE WITH YOU FOR 24 HOURS. YOU MAY GO HOME BY TAXI OR UBER OR ORTHERWISE, BUT AN ADULT MUST ACCOMPANY YOU HOME AND STAY WITH YOU FOR 24 HOURS.  Name and phone number of your driver:  Special Instructions: N/A              Please read over the following fact sheets you were given: _____________________________________________________________________         [facility] - Preparing for Surgery Before surgery, you can play an important role.  Because skin is not sterile, your skin needs to be as free of germs as possible.  You can reduce the number of germs on your skin by washing with CHG (chlorahexidine gluconate) soap before surgery.  CHG is an antiseptic cleaner which kills germs and bonds with the skin to continue killing germs even after washing. Please  DO NOT use if you have an allergy to CHG or antibacterial soaps.  If your skin becomes reddened/irritated stop using the  CHG and inform your nurse when you arrive at Short Stay. Do not shave (including legs and underarms) for at least 48 hours prior to the first CHG shower.  You may shave your face/neck. Please follow these instructions carefully:  1.  Shower with CHG Soap the night before surgery and the  morning of Surgery.  2.  If you choose to wash your hair, wash your hair first as usual with your  normal  shampoo.  3.  After you shampoo, rinse your hair and body thoroughly to remove the  shampoo.                           4.  Use CHG as you would any other liquid soap.  You can apply chg directly  to the skin and wash                       Gently with a scrungie or clean washcloth.  5.  Apply the CHG Soap to your body ONLY FROM THE NECK DOWN.   Do not use on face/ open                           Wound or open sores. Avoid contact with eyes, ears mouth and genitals (private parts).                       Wash face,  Genitals (private parts) with your normal soap.             6.  Wash thoroughly, paying special attention to the area where your surgery  will be performed.  7.  Thoroughly rinse your body with warm water from the neck down.  8.  DO NOT shower/wash with your normal soap after using and rinsing off  the CHG Soap.                9.  Pat yourself dry with a clean towel.            10.  Wear clean pajamas.            11.  Place clean sheets on your bed the night of your first shower and do not  sleep with pets. Day of Surgery : Do not apply any lotions/deodorants the morning of surgery.  Please wear clean clothes to the hospital/surgery center.  FAILURE TO FOLLOW THESE INSTRUCTIONS MAY RESULT IN THE CANCELLATION OF YOUR SURGERY PATIENT SIGNATURE_________________________________  NURSE SIGNATURE__________________________________  ________________________________________________________________________

## 2020-11-29 ENCOUNTER — Encounter (HOSPITAL_COMMUNITY): Payer: Self-pay

## 2020-11-29 ENCOUNTER — Encounter (HOSPITAL_COMMUNITY)
Admission: RE | Admit: 2020-11-29 | Discharge: 2020-11-29 | Disposition: A | Discharge status: Home / Self Care | Payer: Medicare Other | Source: Ambulatory Visit | Attending: Urology | Admitting: Urology

## 2020-11-29 ENCOUNTER — Other Ambulatory Visit: Payer: Self-pay

## 2020-11-29 DIAGNOSIS — Z01812 Encounter for preprocedural laboratory examination: Secondary | ICD-10-CM | POA: Diagnosis not present

## 2020-11-29 LAB — CBC
HCT: 39.9 % (ref 36.0–46.0)
Hemoglobin: 13.8 g/dL (ref 12.0–15.0)
MCH: 33 pg (ref 26.0–34.0)
MCHC: 34.6 g/dL (ref 30.0–36.0)
MCV: 95.5 fL (ref 80.0–100.0)
Platelets: 317 10*3/uL (ref 150–400)
RBC: 4.18 MIL/uL (ref 3.87–5.11)
RDW: 12.1 % (ref 11.5–15.5)
WBC: 4.7 10*3/uL (ref 4.0–10.5)
nRBC: 0 % (ref 0.0–0.2)

## 2020-11-29 LAB — BASIC METABOLIC PANEL
Anion gap: 13 (ref 5–15)
BUN: 18 mg/dL (ref 8–23)
CO2: 26 mmol/L (ref 22–32)
Calcium: 9.4 mg/dL (ref 8.9–10.3)
Chloride: 102 mmol/L (ref 98–111)
Creatinine, Ser: 0.84 mg/dL (ref 0.44–1.00)
GFR, Estimated: >60 mL/min (ref >60)
Glucose, Bld: 120 mg/dL — ABNORMAL HIGH (ref 70–99)
Potassium: 3.3 mmol/L — ABNORMAL LOW (ref 3.5–5.1)
Sodium: 141 mmol/L (ref 135–145)

## 2020-11-29 NOTE — Progress Notes (Signed)
COVID Vaccine Completed:Yes Date COVID Vaccine completed:07/2020 [place] COVID vaccine manufacturer:    Moderna    PCP - Dr. Milagros Evener Cardiologist -   Chest x-ray -  EKG -  Stress Test -  ECHO -  Cardiac Cath -  Pacemaker/ICD device last checked:  Sleep Study -  CPAP -   Fasting Blood Sugar -  Checks Blood Sugar _____ times a day  Blood Thinner Instructions: Aspirin Instructions: Last Dose:  Anesthesia review:   Patient denies shortness of breath, fever, cough and chest pain at PAT appointment   Patient verbalized understanding of instructions that were given to them at the PAT appointment. Patient was also instructed that they will need to review over the PAT instructions again at home before surgery.

## 2020-11-29 NOTE — H&P (Signed)
Office Visit Report     11/27/2020   --------------------------------------------------------------------------------   Maria Willis  MRN: [id number]  DOB: 09-27-1951, 70 year old Female  SSN:    PRIMARY CARE:    REFERRING:    PROVIDER:  Raynelle Bring, M.D.  LOCATION:  [facility] [phone]     --------------------------------------------------------------------------------   CC/HPI: Left ureteral calculus   Ms. Portal is a 70 year old healthy female who developed the acute onset of left lower quadrant pain approximately 11 days ago. Who was felt that this might be related to a bowel related issue. She was evaluated by Dr. Loletha Carrow with gastroenterology. She ultimately underwent a CT scan of the abdomen and pelvis for further evaluation on 11/23/2020. This revealed an obstructing 4 mm distal left ureteral calculus. She has no prior history of urolithiasis. Currently, her pain is controlled with oxycodone. She has had nausea and vomiting. She denies fever. She denies hematuria.     ALLERGIES: amoxicillian codeine Polysporin Sulfa Drugs    MEDICATIONS: Lecithin  Oxycodone Hcl  Vitamin D3     GU PSH: None   NON-GU PSH: Appendectomy Hysterectomy Meniscal Trnspl, Knee W/scpe, Right Remove Gallbladder Remove Ovarian Cyst(s)     GU PMH: No GU PMH    NON-GU PMH: No Non-GU PMH    FAMILY HISTORY: None   SOCIAL HISTORY: Marital Status: Divorced Preferred Language: English; Ethnicity: Not Hispanic Or Latino; Race: White Current Smoking Status: Patient has never smoked.   Tobacco Use Assessment Completed: Used Tobacco in last 30 days? Does drink.  Drinks 1 caffeinated drink per day.    REVIEW OF SYSTEMS:    GU Review Female:   Patient reports frequent urination. Patient denies hard to postpone urination, burning /pain with urination, get up at night to urinate, leakage of urine, stream starts and stops, trouble starting your stream, have to strain  to urinate, and currently pregnant.  Gastrointestinal (Lower):   Patient denies diarrhea and constipation.  Gastrointestinal (Upper):   Patient reports nausea and vomiting.   Constitutional:   Patient denies fever, night sweats, weight loss, and fatigue.  Skin:   Patient denies skin rash/ lesion and itching.  Eyes:   Patient denies blurred vision and double vision.  Ears/ Nose/ Throat:   Patient denies sore throat and sinus problems.  Hematologic/Lymphatic:   Patient denies swollen glands and easy bruising.  Cardiovascular:   Patient denies leg swelling and chest pains.  Respiratory:   Patient denies cough and shortness of breath.  Endocrine:   Patient denies excessive thirst.  Musculoskeletal:   Patient denies back pain and joint pain.  Neurological:   Patient denies headaches and dizziness.  Psychologic:   Patient denies depression and anxiety.   VITAL SIGNS:      11/27/2020 03:06 PM  Weight 125 lb / 56.7 kg  Height 65 in / 165.1 cm  BP 119/75 mmHg  Heart Rate 110 /min  BMI 20.8 kg/m   MULTI-SYSTEM PHYSICAL EXAMINATION:    Constitutional: Well-nourished. No physical deformities. Normally developed. Good grooming.  Respiratory: No labored breathing, no use of accessory muscles.   Cardiovascular: Normal temperature, normal extremity pulses, no swelling, no varicosities.  Gastrointestinal: No mass, no tenderness, no rigidity, non obese abdomen. No CVA tenderness.     Complexity of Data:  Lab Test Review:   BUN/Creatinine  Records Review:   Previous Patient Records  X-Ray Review: C.T. Abdomen/Pelvis: Reviewed Films.    Notes:  Serum creatinine was 0.78 on 11/23/2020.   CLINICAL DATA: Left lower abdominal pain for 1 week   EXAM:  CT ABDOMEN AND PELVIS WITH CONTRAST   TECHNIQUE:  Multidetector CT imaging of the abdomen and pelvis was performed  using the standard protocol following bolus administration of  intravenous contrast.   CONTRAST: 161mL  OMNIPAQUE IOHEXOL 300 MG/ML SOLN   COMPARISON: None.   FINDINGS:  Lower chest: No acute pleural or parenchymal lung disease.   Hepatobiliary: No focal liver abnormality is seen. Status post  cholecystectomy. No biliary dilatation.   Pancreas: Unremarkable. No pancreatic ductal dilatation or  surrounding inflammatory changes.   Spleen: Normal in size without focal abnormality.   Adrenals/Urinary Tract: There is mild left-sided obstructive  uropathy due to an obstructing 4 mm distal left ureteral calculus  reference image 71/2. Normal excretion of contrast on delayed  imaging.   The right kidney is unremarkable. The adrenals and bladder are  normal.   Stomach/Bowel: No bowel obstruction or ileus. No bowel wall  thickening or inflammatory change.   Vascular/Lymphatic: No significant vascular findings are present. No  enlarged abdominal or pelvic lymph nodes.   Reproductive: Status post hysterectomy. No adnexal masses.   Other: No free fluid or free gas. No abdominal wall hernia.   Musculoskeletal: No acute or destructive bony lesions. Reconstructed  images demonstrate no additional findings.   IMPRESSION:  1. Obstructing 4 mm distal left ureteral calculus, with mild left  hydroureter.   These results will be called to the ordering clinician or  representative by the Radiologist Assistant, and communication  documented in the PACS or [employer].    Electronically Signed  By: Randa Ngo M.D.  On: 11/23/2020 19:21   PROCEDURES:          Urinalysis w/Scope - 81001 Dipstick Dipstick Cont'd Micro  Color: Yellow Bilirubin: Neg WBC/hpf: 10 - 20/hpf  Appearance: Cloudy Ketones: 2+ RBC/hpf: 0 - 2/hpf  Specific Gravity: 1.025 Blood: 3+ Bacteria: Many (>50/hpf)  pH: 6.0 Protein: 1+ Cystals: NS (Not Seen)  Glucose: Neg Urobilinogen: 0.2 Casts: Hyaline    Nitrites: Neg Trichomonas: Not Present    Leukocyte Esterase: 1+ Mucous: Not Present      Epithelial Cells: 6 -  10/hpf      Yeast: NS (Not Seen)      Sperm: Not Present    Notes:  Unspun micro due to quantity     ASSESSMENT:      ICD-10 Details  1 GU:   Ureteral calculus - N20.1    PLAN:            Medications New Meds: Tamsulosin Hcl 0.4 mg capsule 1 capsule PO Q HS   #14  0 Refill(s)            Orders Labs Urine Culture          Schedule Return Visit/Planned Activity: Other See Visit Notes             Note: Will call to schedule surgery.          Document Letter(s):  Created for Patient: Clinical Summary         Notes:   1. 4 mm distal left ureteral calculus: She has been provided a prescription for tamsulosin will notify me if she requires additional oxycodone. She also has been provided a strainer and will begin with medical expulsion therapy. However, considering her significant pain and nausea/vomiting symptoms, she is eager to proceed with definitive therapy. After  reviewing options for management, she expressed most interested in proceeding with ureteroscopic treatment. She will therefore be tentatively scheduled for cystoscopy with left retrograde pyelography, left ureteroscopy with laser lithotripsy and possible left ureteral stent. We reviewed the potential risks, complications, and expected recovery process associated with this procedure. She gives informed consent to proceed. In the meantime, she will notify me if she requires additional pain medication or if she passes her stone.   Cc: Dr. Wilfrid Lund  Dr. Milagros Evener        Next Appointment:      Next Appointment: 11/30/2020 11:45 AM    Appointment Type: Surgery     Location: [facility] [phone]    Provider: Raynelle Bring, M.D.    Reason for Visit: WL/OP CYSTO, LT RPG, LT URS LL, LT UR STENT      * Signed by Raynelle Bring, M.D. on 11/28/20 at 10:45 AM (EST)*

## 2020-11-30 ENCOUNTER — Encounter (HOSPITAL_COMMUNITY): Payer: Self-pay | Admitting: Urology

## 2020-11-30 ENCOUNTER — Ambulatory Visit (HOSPITAL_COMMUNITY): Payer: Medicare Other

## 2020-11-30 ENCOUNTER — Ambulatory Visit (HOSPITAL_COMMUNITY): Payer: Medicare Other | Admitting: Anesthesiology

## 2020-11-30 ENCOUNTER — Other Ambulatory Visit: Payer: Medicare Other

## 2020-11-30 ENCOUNTER — Ambulatory Visit (HOSPITAL_COMMUNITY)
Admission: RE | Admit: 2020-11-30 | Discharge: 2020-11-30 | Disposition: A | Discharge status: Home / Self Care | Payer: Medicare Other | Attending: Urology | Admitting: Urology

## 2020-11-30 ENCOUNTER — Encounter (HOSPITAL_COMMUNITY)
Admission: RE | Disposition: A | Discharge status: Home / Self Care | Payer: Self-pay | Source: Home / Self Care | Attending: Urology

## 2020-11-30 DIAGNOSIS — Z885 Allergy status to narcotic agent status: Secondary | ICD-10-CM | POA: Insufficient documentation

## 2020-11-30 DIAGNOSIS — N201 Calculus of ureter: Secondary | ICD-10-CM | POA: Insufficient documentation

## 2020-11-30 DIAGNOSIS — Z882 Allergy status to sulfonamides status: Secondary | ICD-10-CM | POA: Diagnosis not present

## 2020-11-30 HISTORY — PX: CYSTOSCOPY/URETEROSCOPY/HOLMIUM LASER/STENT PLACEMENT: SHX6546

## 2020-11-30 SURGERY — CYSTOSCOPY/URETEROSCOPY/HOLMIUM LASER/STENT PLACEMENT
Anesthesia: General | Laterality: Left

## 2020-11-30 MED ORDER — LIDOCAINE HCL 1 % IJ SOLN
INTRAMUSCULAR | Status: DC | PRN
  Administered 2020-11-30: 50 mg via INTRADERMAL

## 2020-11-30 MED ORDER — FENTANYL CITRATE (PF) 100 MCG/2ML IJ SOLN
INTRAMUSCULAR | Status: DC | PRN
  Administered 2020-11-30: 50 ug via INTRAVENOUS

## 2020-11-30 MED ORDER — CEFAZOLIN SODIUM-DEXTROSE 2-4 GM/100ML-% IV SOLN
INTRAVENOUS | Status: AC
  Filled 2020-11-30: qty 100

## 2020-11-30 MED ORDER — PROMETHAZINE HCL 25 MG/ML IJ SOLN
6.2500 mg | INTRAMUSCULAR | Status: DC | PRN
Start: 2020-11-30 — End: 2020-11-30

## 2020-11-30 MED ORDER — DEXAMETHASONE SODIUM PHOSPHATE 10 MG/ML IJ SOLN
INTRAMUSCULAR | Status: DC | PRN
  Administered 2020-11-30: 10 mg via INTRAVENOUS

## 2020-11-30 MED ORDER — MIDAZOLAM HCL 5 MG/5ML IJ SOLN
INTRAMUSCULAR | Status: DC | PRN
  Administered 2020-11-30 (×2): 1 mg via INTRAVENOUS

## 2020-11-30 MED ORDER — SODIUM CHLORIDE 0.9 % IR SOLN
Status: DC | PRN
  Administered 2020-11-30 (×2): 3000 mL

## 2020-11-30 MED ORDER — FENTANYL CITRATE (PF) 100 MCG/2ML IJ SOLN: 25.0000 ug | INTRAMUSCULAR | Status: DC | PRN

## 2020-11-30 MED ORDER — ONDANSETRON HCL 4 MG/2ML IJ SOLN
INTRAMUSCULAR | Status: DC | PRN
  Administered 2020-11-30: 4 mg via INTRAVENOUS

## 2020-11-30 MED ORDER — FENTANYL CITRATE (PF) 100 MCG/2ML IJ SOLN
INTRAMUSCULAR | Status: AC
  Filled 2020-11-30: qty 2

## 2020-11-30 MED ORDER — ONDANSETRON HCL 4 MG/2ML IJ SOLN
INTRAMUSCULAR | Status: AC
  Filled 2020-11-30: qty 2

## 2020-11-30 MED ORDER — APREPITANT 40 MG PO CAPS: 40.0000 mg | ORAL_CAPSULE | Freq: Once | ORAL | Status: AC

## 2020-11-30 MED ORDER — MIDAZOLAM HCL 2 MG/2ML IJ SOLN
INTRAMUSCULAR | Status: AC
  Filled 2020-11-30: qty 2

## 2020-11-30 MED ORDER — PROPOFOL 10 MG/ML IV BOLUS
INTRAVENOUS | Status: AC
  Filled 2020-11-30: qty 20

## 2020-11-30 MED ORDER — ORAL CARE MOUTH RINSE: 15.0000 mL | Freq: Once | OROMUCOSAL | Status: AC

## 2020-11-30 MED ORDER — AMISULPRIDE (ANTIEMETIC) 5 MG/2ML IV SOLN: 10.0000 mg | Freq: Once | INTRAVENOUS | Status: DC | PRN

## 2020-11-30 MED ORDER — LACTATED RINGERS IV SOLN: INTRAVENOUS | Status: DC

## 2020-11-30 MED ORDER — APREPITANT 40 MG PO CAPS
ORAL_CAPSULE | ORAL | Status: AC
  Administered 2020-11-30: 40 mg via ORAL
  Filled 2020-11-30: qty 1

## 2020-11-30 MED ORDER — CEFAZOLIN SODIUM-DEXTROSE 2-4 GM/100ML-% IV SOLN
2.0000 g | Freq: Once | INTRAVENOUS | Status: AC
  Administered 2020-11-30: 2 g via INTRAVENOUS

## 2020-11-30 MED ORDER — IOHEXOL 300 MG/ML  SOLN
INTRAMUSCULAR | Status: DC | PRN
  Administered 2020-11-30: 1 mL

## 2020-11-30 MED ORDER — ACETAMINOPHEN 10 MG/ML IV SOLN: 1000.0000 mg | Freq: Once | INTRAVENOUS | Status: DC | PRN

## 2020-11-30 MED ORDER — CHLORHEXIDINE GLUCONATE 0.12 % MT SOLN
15.0000 mL | Freq: Once | OROMUCOSAL | Status: AC
  Administered 2020-11-30: 15 mL via OROMUCOSAL

## 2020-11-30 MED ORDER — PROPOFOL 10 MG/ML IV BOLUS
INTRAVENOUS | Status: DC | PRN
  Administered 2020-11-30: 150 mg via INTRAVENOUS

## 2020-11-30 SURGICAL SUPPLY — 20 items
BAG URO CATCHER STRL LF (MISCELLANEOUS) ×2 IMPLANT
BASKET ZERO TIP NITINOL 2.4FR (BASKET) ×2 IMPLANT
CATH INTERMIT  6FR 70CM (CATHETERS) IMPLANT
CLOTH BEACON ORANGE TIMEOUT ST (SAFETY) ×2 IMPLANT
FIBER LASER FLEXIVA 365 (UROLOGICAL SUPPLIES) ×2 IMPLANT
GLOVE SURG ENC TEXT LTX SZ7.5 (GLOVE) ×2 IMPLANT
GOWN STRL REUS W/TWL LRG LVL3 (GOWN DISPOSABLE) ×2 IMPLANT
GUIDEWIRE STR DUAL SENSOR (WIRE) ×2 IMPLANT
GUIDEWIRE ZIPWRE .038 STRAIGHT (WIRE) IMPLANT
IV NS 1000ML (IV SOLUTION) ×2
IV NS 1000ML BAXH (IV SOLUTION) ×1 IMPLANT
KIT TURNOVER KIT A (KITS) ×2 IMPLANT
LASER FIB FLEXIVA PULSE ID 365 (Laser) IMPLANT
MANIFOLD NEPTUNE II (INSTRUMENTS) ×2 IMPLANT
PACK CYSTO (CUSTOM PROCEDURE TRAY) ×2 IMPLANT
SHEATH URETERAL 12FRX35CM (MISCELLANEOUS) IMPLANT
TRACTIP FLEXIVA PULS ID 200XHI (Laser) IMPLANT
TRACTIP FLEXIVA PULSE ID 200 (Laser)
TUBING CONNECTING 10 (TUBING) ×2 IMPLANT
TUBING UROLOGY SET (TUBING) ×2 IMPLANT

## 2020-11-30 NOTE — Op Note (Signed)
Preoperative diagnosis: Left ureteral calculus  Postoperative diagnosis: Left ureteral calculus  Procedure:  1. Cystoscopy 2. Left ureteroscopy and stone removal 3. Ureteroscopic laser lithotripsy 4. Left retrograde pyelography with interpretation  Surgeon: Pryor Curia. M.D.  Anesthesia: General  Complications: None  Intraoperative findings: Left retrograde pyelography demonstrated a filling defect within the distal left ureter consistent with the patient's known calculus without other abnormalities.  EBL: Minimal  Specimens: 1. Left ureteral calculus  Disposition of specimens: [facility] for stone analysis  Indication: Maria Willis is a 70 y.o. year old patient with a symptomatic distal left ureteral calculus. After reviewing the management options for treatment, the patient elected to proceed with the above surgical procedure(s). We have discussed the potential benefits and risks of the procedure, side effects of the proposed treatment, the likelihood of the patient achieving the goals of the procedure, and any potential problems that might occur during the procedure or recuperation. Informed consent has been obtained.  Description of procedure:  The patient was taken to the operating room and general anesthesia was induced.  The patient was placed in the dorsal lithotomy position, prepped and draped in the usual sterile fashion, and preoperative antibiotics were administered. A preoperative time-out was performed.   Cystourethroscopy was performed.  The patient's urethra was examined and was normal. The bladder was then systematically examined in its entirety. There was no evidence for any bladder tumors, stones, or other mucosal pathology.    Attention then turned to the left ureteral orifice and a ureteral catheter was used to intubate the ureteral orifice.  Omnipaque contrast was injected through the ureteral catheter and a retrograde pyelogram was  performed with findings as dictated above.  A 0.38 sensor guidewire was then advanced up the left ureter into the renal pelvis under fluoroscopic guidance. The 6 Fr semirigid ureteroscope was then advanced into the ureter next to the guidewire and the calculus was identified.   The stone was then fragmented with the 365 micron holmium laser fiber on a setting of 0.6 J and frequency of 6 Hz.   All stones were then removed from the ureter with a zero tip nitinol basket.  Reinspection of the ureter revealed no remaining visible stones or fragments.   The wire was then backloaded through the cystoscope and a ureteral stent was advance over the wire using Seldinger technique.  The stent was positioned appropriately under fluoroscopic and cystoscopic guidance.  The wire was then removed with an adequate stent curl noted in the renal pelvis as well as in the bladder.  The bladder was then emptied and the procedure ended.  The patient appeared to tolerate the procedure well and without complications.  The patient was able to be awakened and transferred to the recovery unit in satisfactory condition.

## 2020-11-30 NOTE — Interval H&P Note (Signed)
History and Physical Interval Note:  11/30/2020 10:11 AM  Maria Willis  has presented today for surgery, with the diagnosis of LEFT URETERAL CALCULUS.  The various methods of treatment have been discussed with the patient and family. After consideration of risks, benefits and other options for treatment, the patient has consented to  Procedure(s) with comments: CYSTOSCOPY/RETROGRADE/URETEROSCOPY/HOLMIUM LASER/STENT PLACEMENT (Left) - ONLY NEEDS 45 MIN as a surgical intervention.  The patient's history has been reviewed, patient examined, no change in status, stable for surgery.  I have reviewed the patient's chart and labs.  Questions were answered to the patient's satisfaction.     [employer]

## 2020-11-30 NOTE — Transfer of Care (Signed)
Immediate Anesthesia Transfer of Care Note  Patient: Maria Willis  Procedure(s) Performed: Procedure(s) with comments: CYSTOSCOPY/RETROGRADE/URETEROSCOPY/HOLMIUM LASER (Left) - ONLY NEEDS 71 MIN  Patient Location: PACU  Anesthesia Type:General  Level of Consciousness: Alert, Awake, Oriented  Airway & Oxygen Therapy: Patient Spontanous Breathing  Post-op Assessment: Report given to RN  Post vital signs: Reviewed and stable  Last Vitals:  Vitals:   11/30/20 0959  BP: (!) 160/83  Pulse: 94  Resp: 15  Temp: 36.7 C  SpO2: 703%    Complications: No apparent anesthesia complications

## 2020-11-30 NOTE — Anesthesia Procedure Notes (Signed)
Procedure Name: LMA Insertion Date/Time: 11/30/2020 11:47 AM Performed by: Garrel Ridgel, CRNA Pre-anesthesia Checklist: Emergency Drugs available, Patient identified and Suction available Patient Re-evaluated:Patient Re-evaluated prior to induction Oxygen Delivery Method: Circle system utilized Preoxygenation: Pre-oxygenation with 100% oxygen Induction Type: IV induction Ventilation: Mask ventilation without difficulty LMA: LMA inserted LMA Size: 4.0 Grade View: Grade I Placement Confirmation: positive ETCO2 Tube secured with: Tape Dental Injury: Teeth and Oropharynx as per pre-operative assessment

## 2020-11-30 NOTE — Discharge Instructions (Signed)
1. You may see some blood in the urine and may have some burning with urination for 48-72 hours. You also may notice that you have to urinate more frequently or urgently after your procedure which is normal.  °2. You should call should you develop an inability urinate, fever > 101, persistent nausea and vomiting that prevents you from eating or drinking to stay hydrated.  °

## 2020-11-30 NOTE — Anesthesia Preprocedure Evaluation (Addendum)
Anesthesia Evaluation  Patient identified by MRN, date of birth, ID band Patient awake    History of Anesthesia Complications (+) PONV  Airway Mallampati: II  TM Distance: >3 FB Neck ROM: Full    Dental  (+) Teeth Intact   Pulmonary neg pulmonary ROS,    Pulmonary exam normal        Cardiovascular negative cardio ROS   Rhythm:Regular Rate:Normal     Neuro/Psych negative neurological ROS  negative psych ROS   GI/Hepatic negative GI ROS, Neg liver ROS,   Endo/Other  negative endocrine ROS  Renal/GU Left ureteral calculus   negative genitourinary   Musculoskeletal negative musculoskeletal ROS (+)   Abdominal (+)  Abdomen: soft. Bowel sounds: normal.  Peds  Hematology negative hematology ROS (+)   Anesthesia Other Findings   Reproductive/Obstetrics                             Anesthesia Physical Anesthesia Plan  ASA: II  Anesthesia Plan: General   Post-op Pain Management:    Induction: Intravenous  PONV Risk Score and Plan: 4 or greater and Ondansetron, Dexamethasone, Treatment may vary due to age or medical condition, Aprepitant and Amisulpride  Airway Management Planned: Mask and LMA  Additional Equipment: None  Intra-op Plan:   Post-operative Plan: Extubation in OR  Informed Consent: I have reviewed the patients History and Physical, chart, labs and discussed the procedure including the risks, benefits and alternatives for the proposed anesthesia with the patient or authorized representative who has indicated his/her understanding and acceptance.     Dental advisory given  Plan Discussed with: CRNA  Anesthesia Plan Comments: (Lab Results      Component                Value               Date                      WBC                      4.7                 11/29/2020                HGB                      13.8                11/29/2020                HCT                       39.9                11/29/2020                MCV                      95.5                11/29/2020                PLT                      317  11/29/2020           Lab Results      Component                Value               Date                      NA                       141                 11/29/2020                K                        3.3 (L)             11/29/2020                CO2                      26                  11/29/2020                GLUCOSE                  120 (H)             11/29/2020                BUN                      18                  11/29/2020                CREATININE               0.84                11/29/2020                CALCIUM                  9.4                 11/29/2020                GFRNONAA                 >60                 11/29/2020          )        Anesthesia Quick Evaluation

## 2020-12-01 ENCOUNTER — Encounter (HOSPITAL_COMMUNITY): Payer: Self-pay | Admitting: Urology

## 2020-12-01 NOTE — Anesthesia Postprocedure Evaluation (Signed)
Anesthesia Post Note  Patient: Maria Willis  Procedure(s) Performed: CYSTOSCOPY/RETROGRADE/URETEROSCOPY/HOLMIUM LASER (Left )     Patient location during evaluation: PACU Anesthesia Type: General Level of consciousness: awake and alert Pain management: pain level controlled Vital Signs Assessment: post-procedure vital signs reviewed and stable Respiratory status: spontaneous breathing, nonlabored ventilation, respiratory function stable and patient connected to nasal cannula oxygen Cardiovascular status: blood pressure returned to baseline and stable Postop Assessment: no apparent nausea or vomiting Anesthetic complications: no   No complications documented.  Last Vitals:  Vitals:   11/30/20 1326 11/30/20 1347  BP: (!) 155/89 138/85  Pulse: 69 72  Resp: 15 16  Temp: (!) 36.3 C (!) 36.3 C  SpO2: 100% 100%    Last Pain:  Vitals:   11/30/20 1347  PainSc: 0-No pain                 Belenda Cruise P Martine Trageser

## 2020-12-26 DIAGNOSIS — N201 Calculus of ureter: Secondary | ICD-10-CM | POA: Diagnosis not present

## 2021-01-12 DIAGNOSIS — H6123 Impacted cerumen, bilateral: Secondary | ICD-10-CM | POA: Diagnosis not present

## 2021-01-24 DIAGNOSIS — M8580 Other specified disorders of bone density and structure, unspecified site: Secondary | ICD-10-CM | POA: Diagnosis not present

## 2021-01-24 DIAGNOSIS — Z780 Asymptomatic menopausal state: Secondary | ICD-10-CM | POA: Diagnosis not present

## 2021-02-19 DIAGNOSIS — Z23 Encounter for immunization: Secondary | ICD-10-CM | POA: Diagnosis not present

## 2021-04-30 DIAGNOSIS — L821 Other seborrheic keratosis: Secondary | ICD-10-CM | POA: Diagnosis not present

## 2021-04-30 DIAGNOSIS — L570 Actinic keratosis: Secondary | ICD-10-CM | POA: Diagnosis not present

## 2021-04-30 DIAGNOSIS — Z85828 Personal history of other malignant neoplasm of skin: Secondary | ICD-10-CM | POA: Diagnosis not present

## 2021-07-27 DIAGNOSIS — E559 Vitamin D deficiency, unspecified: Secondary | ICD-10-CM | POA: Diagnosis not present

## 2021-07-27 DIAGNOSIS — I10 Essential (primary) hypertension: Secondary | ICD-10-CM | POA: Diagnosis not present

## 2021-07-27 DIAGNOSIS — Z0001 Encounter for general adult medical examination with abnormal findings: Secondary | ICD-10-CM | POA: Diagnosis not present

## 2021-07-30 DIAGNOSIS — M8589 Other specified disorders of bone density and structure, multiple sites: Secondary | ICD-10-CM | POA: Diagnosis not present

## 2021-07-30 DIAGNOSIS — Z01419 Encounter for gynecological examination (general) (routine) without abnormal findings: Secondary | ICD-10-CM | POA: Diagnosis not present

## 2021-07-30 DIAGNOSIS — E7800 Pure hypercholesterolemia, unspecified: Secondary | ICD-10-CM | POA: Diagnosis not present

## 2021-07-30 DIAGNOSIS — M8580 Other specified disorders of bone density and structure, unspecified site: Secondary | ICD-10-CM | POA: Diagnosis not present

## 2021-07-30 DIAGNOSIS — Z0000 Encounter for general adult medical examination without abnormal findings: Secondary | ICD-10-CM | POA: Diagnosis not present

## 2021-08-27 ENCOUNTER — Other Ambulatory Visit: Payer: Self-pay | Admitting: Registered Nurse

## 2021-08-27 ENCOUNTER — Other Ambulatory Visit: Payer: Self-pay | Admitting: Family Medicine

## 2021-08-27 DIAGNOSIS — Z1231 Encounter for screening mammogram for malignant neoplasm of breast: Secondary | ICD-10-CM

## 2021-08-30 DIAGNOSIS — H6123 Impacted cerumen, bilateral: Secondary | ICD-10-CM | POA: Diagnosis not present

## 2021-09-25 DIAGNOSIS — H43811 Vitreous degeneration, right eye: Secondary | ICD-10-CM | POA: Diagnosis not present

## 2021-10-02 DIAGNOSIS — Z23 Encounter for immunization: Secondary | ICD-10-CM | POA: Diagnosis not present

## 2021-10-26 ENCOUNTER — Ambulatory Visit
Admission: RE | Admit: 2021-10-26 | Discharge: 2021-10-26 | Disposition: A | Discharge status: Home / Self Care | Payer: Medicare Other | Source: Ambulatory Visit | Attending: Registered Nurse | Admitting: Registered Nurse

## 2021-10-26 DIAGNOSIS — Z1231 Encounter for screening mammogram for malignant neoplasm of breast: Secondary | ICD-10-CM | POA: Diagnosis not present

## 2021-10-30 DIAGNOSIS — L570 Actinic keratosis: Secondary | ICD-10-CM | POA: Diagnosis not present

## 2021-10-30 DIAGNOSIS — Z85828 Personal history of other malignant neoplasm of skin: Secondary | ICD-10-CM | POA: Diagnosis not present

## 2021-10-30 DIAGNOSIS — L821 Other seborrheic keratosis: Secondary | ICD-10-CM | POA: Diagnosis not present

## 2021-10-30 DIAGNOSIS — D1801 Hemangioma of skin and subcutaneous tissue: Secondary | ICD-10-CM | POA: Diagnosis not present

## 2021-10-30 DIAGNOSIS — L2089 Other atopic dermatitis: Secondary | ICD-10-CM | POA: Diagnosis not present

## 2021-10-30 DIAGNOSIS — C44519 Basal cell carcinoma of skin of other part of trunk: Secondary | ICD-10-CM | POA: Diagnosis not present

## 2021-11-19 DIAGNOSIS — H02831 Dermatochalasis of right upper eyelid: Secondary | ICD-10-CM | POA: Diagnosis not present

## 2021-11-19 DIAGNOSIS — H60392 Other infective otitis externa, left ear: Secondary | ICD-10-CM | POA: Diagnosis not present

## 2021-11-19 DIAGNOSIS — H66002 Acute suppurative otitis media without spontaneous rupture of ear drum, left ear: Secondary | ICD-10-CM | POA: Diagnosis not present

## 2021-11-19 DIAGNOSIS — H6122 Impacted cerumen, left ear: Secondary | ICD-10-CM | POA: Diagnosis not present

## 2021-11-19 DIAGNOSIS — H25813 Combined forms of age-related cataract, bilateral: Secondary | ICD-10-CM | POA: Diagnosis not present

## 2021-11-19 DIAGNOSIS — H5203 Hypermetropia, bilateral: Secondary | ICD-10-CM | POA: Diagnosis not present

## 2021-11-19 DIAGNOSIS — H43811 Vitreous degeneration, right eye: Secondary | ICD-10-CM | POA: Diagnosis not present

## 2021-12-10 DIAGNOSIS — L03019 Cellulitis of unspecified finger: Secondary | ICD-10-CM | POA: Diagnosis not present

## 2021-12-29 DIAGNOSIS — L03011 Cellulitis of right finger: Secondary | ICD-10-CM | POA: Diagnosis not present

## 2022-01-02 DIAGNOSIS — L02511 Cutaneous abscess of right hand: Secondary | ICD-10-CM | POA: Diagnosis not present

## 2022-01-02 DIAGNOSIS — I7300 Raynaud's syndrome without gangrene: Secondary | ICD-10-CM | POA: Diagnosis not present

## 2022-01-02 DIAGNOSIS — B3731 Acute candidiasis of vulva and vagina: Secondary | ICD-10-CM | POA: Diagnosis not present

## 2022-01-16 IMAGING — MG MM DIGITAL SCREENING BILAT W/ TOMO AND CAD
8 series · 9 of 24 positions shown · non-contrast
Comparison: Previous exam(s).

CLINICAL DATA: Screening.

EXAM:
DIGITAL SCREENING BILATERAL MAMMOGRAM WITH TOMOSYNTHESIS AND CAD
TECHNIQUE: Bilateral screening digital craniocaudal and mediolateral oblique
mammograms were obtained. Bilateral screening digital breast
tomosynthesis was performed. The images were evaluated with
computer-aided detection.

[L CC synth-2D]
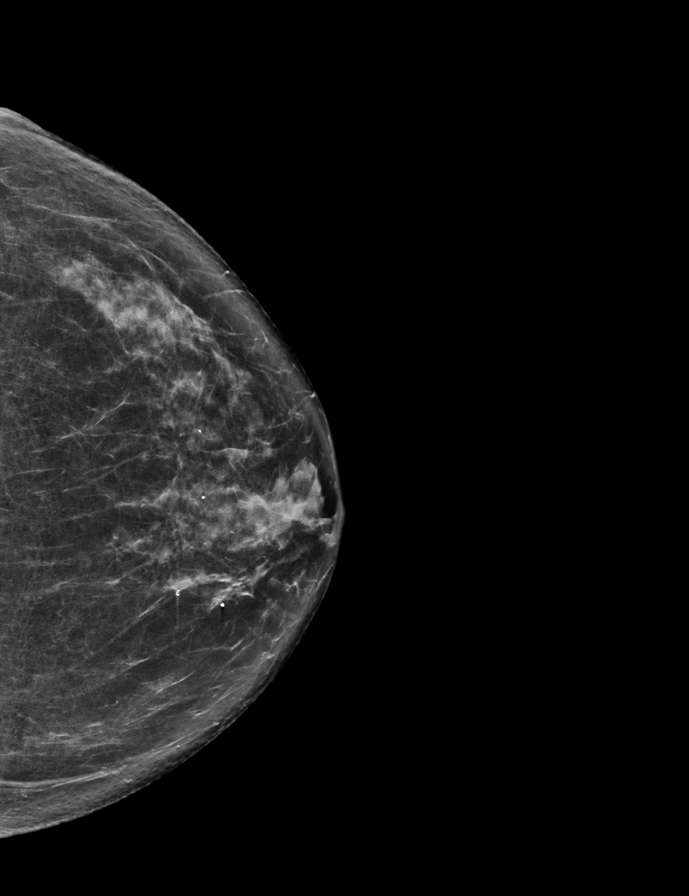

[L MLO synth-2D]
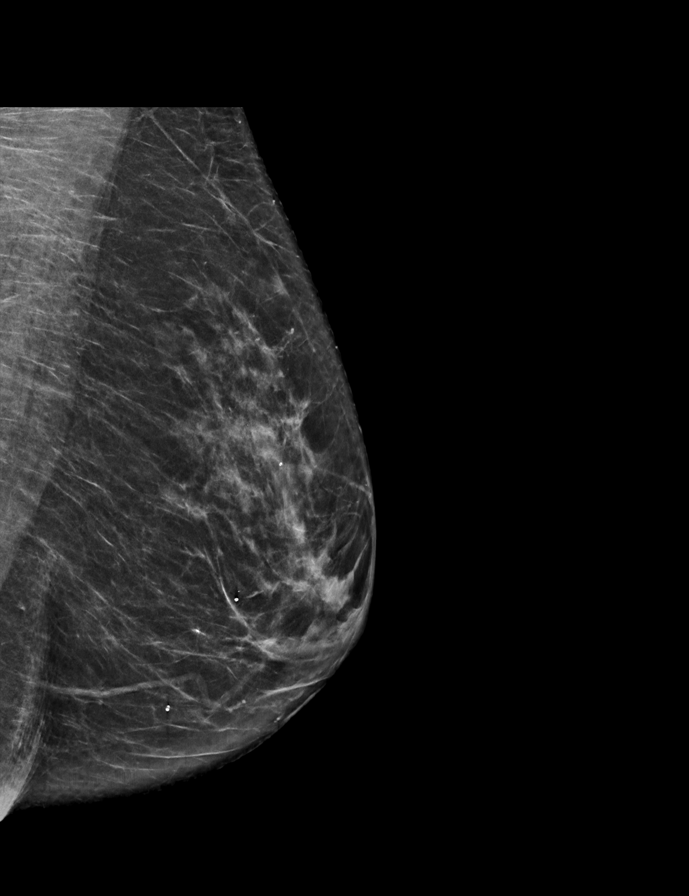

[R MLO synth-2D]
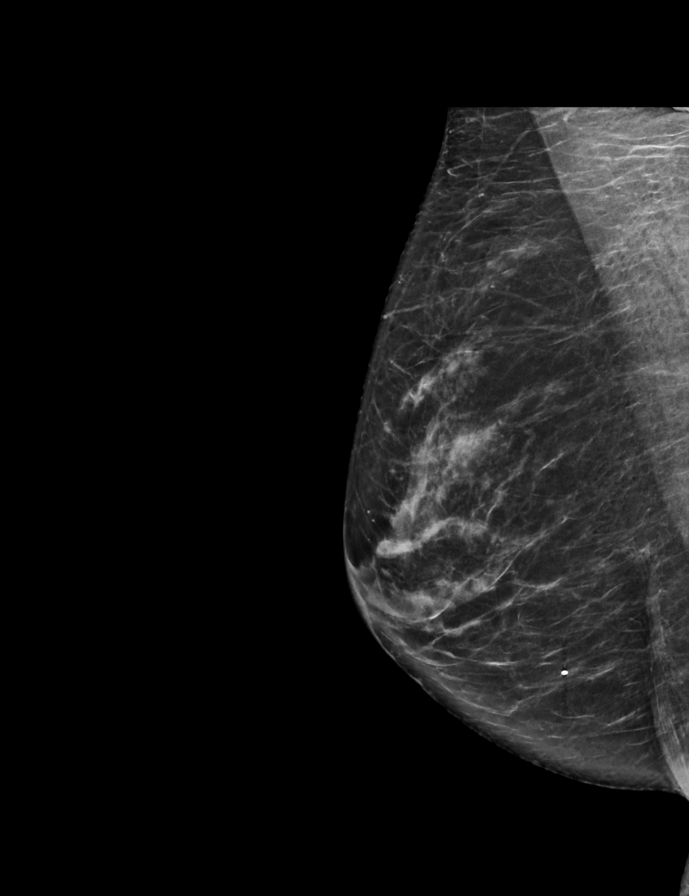

[R CC synth-2D]
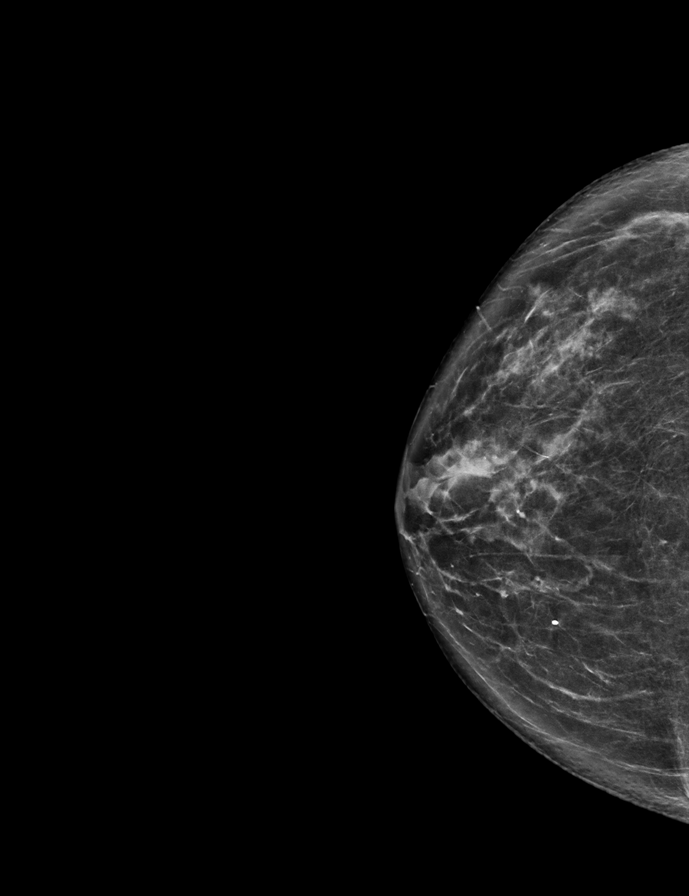

[L CC tomo · 2 of 66 frames shown]
[frame 22/66]
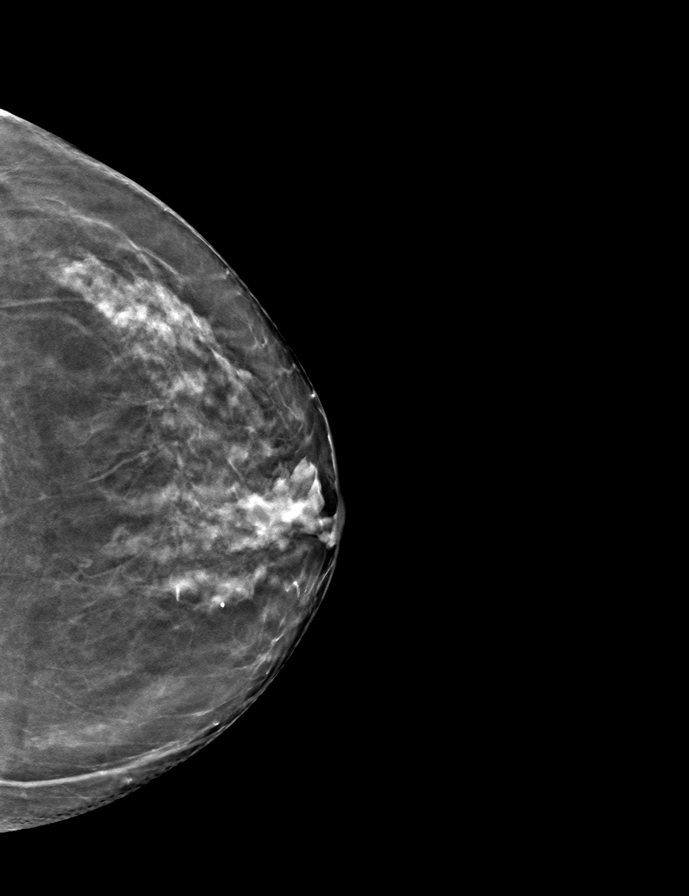
[frame 33/66]
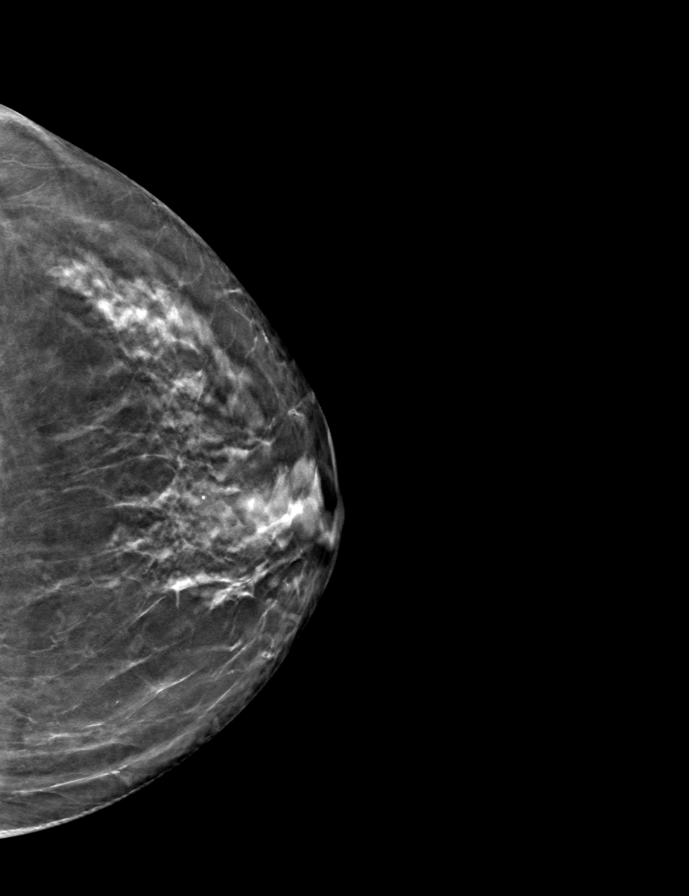

[R CC tomo · tomo slice 35/70.0]
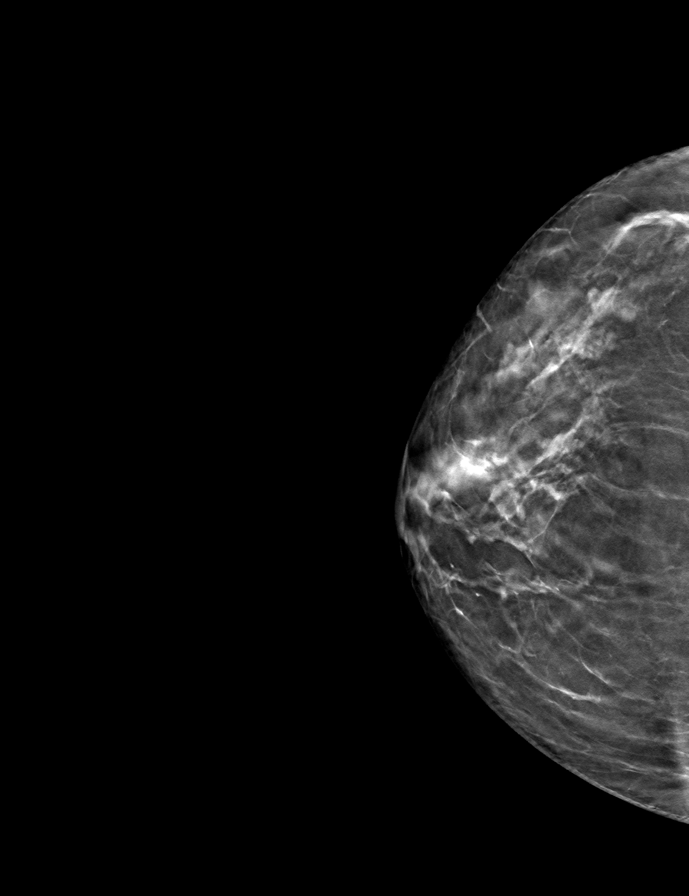

[R MLO tomo · tomo slice 34/67.0]
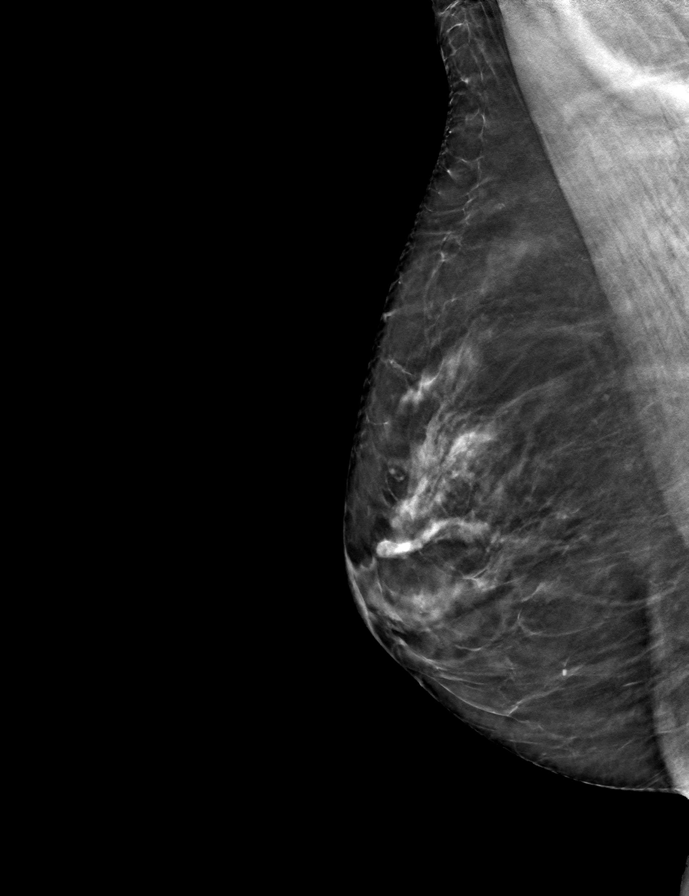

[L MLO tomo · tomo slice 33/66.0]
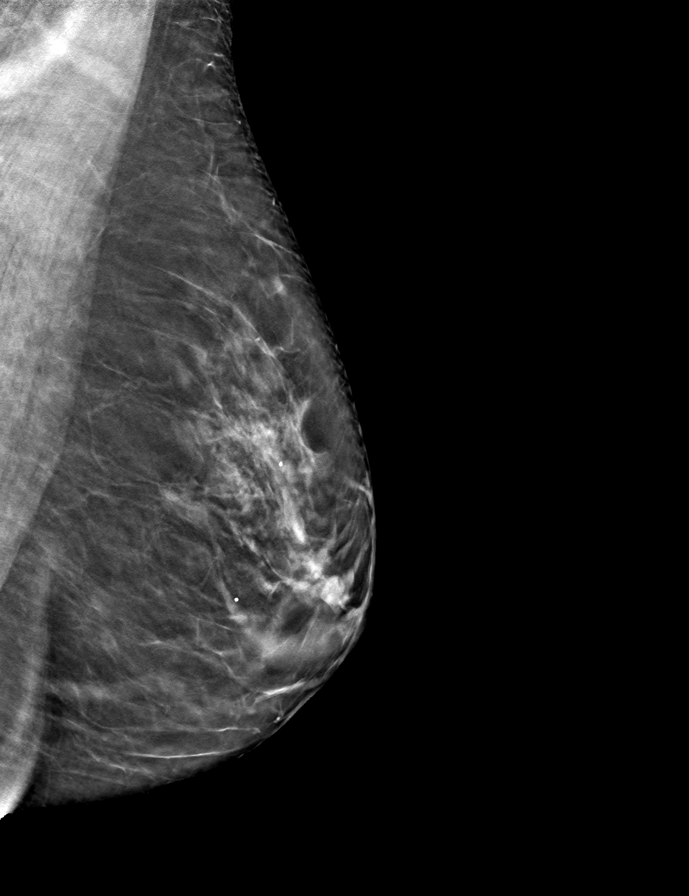

[9 of 24 positions shown; findings below may reference images not displayed]

ACR Breast Density Category c: The breast tissue is heterogeneously
dense, which may obscure small masses.
FINDINGS: There are no findings suspicious for malignancy.
IMPRESSION: No mammographic evidence of malignancy. A result letter of this
screening mammogram will be mailed directly to the patient.

RECOMMENDATION:
Screening mammogram in one year. (Code:Q3-W-BC3)

BI-RADS CATEGORY  1: Negative.

## 2022-01-22 DIAGNOSIS — Z85828 Personal history of other malignant neoplasm of skin: Secondary | ICD-10-CM | POA: Diagnosis not present

## 2022-01-22 DIAGNOSIS — L03011 Cellulitis of right finger: Secondary | ICD-10-CM | POA: Diagnosis not present

## 2022-02-10 DIAGNOSIS — H6123 Impacted cerumen, bilateral: Secondary | ICD-10-CM | POA: Diagnosis not present

## 2022-03-05 DIAGNOSIS — Z85828 Personal history of other malignant neoplasm of skin: Secondary | ICD-10-CM | POA: Diagnosis not present

## 2022-03-05 DIAGNOSIS — L239 Allergic contact dermatitis, unspecified cause: Secondary | ICD-10-CM | POA: Diagnosis not present

## 2022-03-25 DIAGNOSIS — L03011 Cellulitis of right finger: Secondary | ICD-10-CM | POA: Diagnosis not present

## 2022-03-25 DIAGNOSIS — Z85828 Personal history of other malignant neoplasm of skin: Secondary | ICD-10-CM | POA: Diagnosis not present

## 2022-06-02 DIAGNOSIS — H6503 Acute serous otitis media, bilateral: Secondary | ICD-10-CM | POA: Diagnosis not present

## 2022-06-02 DIAGNOSIS — Z6820 Body mass index (BMI) 20.0-20.9, adult: Secondary | ICD-10-CM | POA: Diagnosis not present

## 2022-07-02 DIAGNOSIS — N3000 Acute cystitis without hematuria: Secondary | ICD-10-CM | POA: Diagnosis not present

## 2022-07-02 DIAGNOSIS — R350 Frequency of micturition: Secondary | ICD-10-CM | POA: Diagnosis not present

## 2022-07-26 ENCOUNTER — Other Ambulatory Visit: Payer: Self-pay | Admitting: Registered Nurse

## 2022-07-26 DIAGNOSIS — Z1231 Encounter for screening mammogram for malignant neoplasm of breast: Secondary | ICD-10-CM

## 2022-08-03 DIAGNOSIS — Z23 Encounter for immunization: Secondary | ICD-10-CM | POA: Diagnosis not present

## 2022-08-05 DIAGNOSIS — E7800 Pure hypercholesterolemia, unspecified: Secondary | ICD-10-CM | POA: Diagnosis not present

## 2022-08-05 DIAGNOSIS — Z0000 Encounter for general adult medical examination without abnormal findings: Secondary | ICD-10-CM | POA: Diagnosis not present

## 2022-08-12 ENCOUNTER — Other Ambulatory Visit (HOSPITAL_COMMUNITY): Payer: Self-pay | Admitting: Registered Nurse

## 2022-08-12 ENCOUNTER — Other Ambulatory Visit: Payer: Self-pay | Admitting: Registered Nurse

## 2022-08-12 DIAGNOSIS — R311 Benign essential microscopic hematuria: Secondary | ICD-10-CM | POA: Diagnosis not present

## 2022-08-12 DIAGNOSIS — E7800 Pure hypercholesterolemia, unspecified: Secondary | ICD-10-CM | POA: Diagnosis not present

## 2022-08-12 DIAGNOSIS — Z0000 Encounter for general adult medical examination without abnormal findings: Secondary | ICD-10-CM | POA: Diagnosis not present

## 2022-08-12 DIAGNOSIS — Z8249 Family history of ischemic heart disease and other diseases of the circulatory system: Secondary | ICD-10-CM | POA: Diagnosis not present

## 2022-08-12 DIAGNOSIS — F439 Reaction to severe stress, unspecified: Secondary | ICD-10-CM | POA: Diagnosis not present

## 2022-08-12 DIAGNOSIS — E559 Vitamin D deficiency, unspecified: Secondary | ICD-10-CM | POA: Diagnosis not present

## 2022-08-12 DIAGNOSIS — Z23 Encounter for immunization: Secondary | ICD-10-CM | POA: Diagnosis not present

## 2022-08-15 ENCOUNTER — Other Ambulatory Visit: Payer: Self-pay

## 2022-08-15 ENCOUNTER — Encounter (HOSPITAL_BASED_OUTPATIENT_CLINIC_OR_DEPARTMENT_OTHER): Payer: Self-pay

## 2022-08-15 ENCOUNTER — Emergency Department (HOSPITAL_BASED_OUTPATIENT_CLINIC_OR_DEPARTMENT_OTHER)
Admission: EM | Admit: 2022-08-15 | Discharge: 2022-08-15 | Disposition: A | Discharge status: Home / Self Care | Payer: Medicare Other | Attending: Emergency Medicine | Admitting: Emergency Medicine

## 2022-08-15 ENCOUNTER — Emergency Department (HOSPITAL_BASED_OUTPATIENT_CLINIC_OR_DEPARTMENT_OTHER): Payer: Medicare Other

## 2022-08-15 DIAGNOSIS — Y92009 Unspecified place in unspecified non-institutional (private) residence as the place of occurrence of the external cause: Secondary | ICD-10-CM | POA: Diagnosis not present

## 2022-08-15 DIAGNOSIS — S93491A Sprain of other ligament of right ankle, initial encounter: Secondary | ICD-10-CM

## 2022-08-15 DIAGNOSIS — Y990 Civilian activity done for income or pay: Secondary | ICD-10-CM | POA: Insufficient documentation

## 2022-08-15 DIAGNOSIS — M25571 Pain in right ankle and joints of right foot: Secondary | ICD-10-CM | POA: Insufficient documentation

## 2022-08-15 DIAGNOSIS — X501XXA Overexertion from prolonged static or awkward postures, initial encounter: Secondary | ICD-10-CM | POA: Diagnosis not present

## 2022-08-15 DIAGNOSIS — S93431A Sprain of tibiofibular ligament of right ankle, initial encounter: Secondary | ICD-10-CM | POA: Insufficient documentation

## 2022-08-15 DIAGNOSIS — S99911A Unspecified injury of right ankle, initial encounter: Secondary | ICD-10-CM | POA: Diagnosis present

## 2022-08-15 NOTE — ED Notes (Signed)
Patient verbalizes understanding of discharge instructions. Opportunity for questioning and answers were provided. Patient discharged from ED.  °

## 2022-08-15 NOTE — ED Provider Notes (Signed)
[facility] EMERGENCY DEPT Provider Note   CSN: [id number] Arrival date & time: 08/15/22  1438     History  Chief Complaint  Patient presents with   Ankle Injury    Maria Willis is a 71 y.o. female who presents to the ED for evaluation of right ankle injury.  Patient reports prior to arrival she was at home doing zoom calls when she stood to walk to the kitchen.  Patient reports that her right leg had fallen asleep and when she went to take a step she rolled her right ankle.  The patient denies falling or hitting her head.  Patient here complaining of pain to the lateral side of her right ankle.  Patient reports she took ibuprofen prior to arrival. Patient denies numbness or tingling.    Ankle Injury       Home Medications Prior to Admission medications   Medication Sig Start Date End Date Taking? Authorizing Provider  cholecalciferol (VITAMIN D3) 25 MCG (1000 UNIT) tablet Take 1,000 Units by mouth every Sunday.    [provider]  ibuprofen (ADVIL) 200 MG tablet Take 200 mg by mouth every 6 (six) hours as needed for headache or moderate pain.    [provider]  LECITHIN CONCENTRATE PO Take 1,200 mg by mouth daily.    [provider]  NON FORMULARY Take 1 tablet by mouth daily. Glycoberine k-127    [provider]  NON FORMULARY Take 2 capsules by mouth daily. CholerReqII    [provider]  OVER THE COUNTER MEDICATION Take 2 tablets by mouth daily. guggul lipid    [provider]  oxyCODONE-acetaminophen (PERCOCET) 5-325 MG tablet Take 1 tablet by mouth every 6 (six) hours as needed for severe pain. 11/23/20   Doran Stabler, MD      Allergies    Codeine sulfate, Drug ingredient [monosodium glutamate], Sulfa antibiotics, Sulfonamide derivatives, Amoxicillin, and Neosporin [neomycin-bacitracin zn-polymyx]    Review of Systems   Review of Systems  Musculoskeletal:  Positive for arthralgias.    Physical  Exam Updated Vital Signs BP (!) 171/83 (BP Location: Right Arm)   Pulse 80   Temp 98.4 F (36.9 C) (Oral)   Resp 16   Ht '5\' 5"'$  (1.651 m)   Wt 54.4 kg   SpO2 99%   BMI 19.96 kg/m  Physical Exam Vitals and nursing note reviewed.  Constitutional:      General: She is not in acute distress.    Appearance: Normal appearance. She is not ill-appearing, toxic-appearing or diaphoretic.  HENT:     Head: Normocephalic and atraumatic.     Nose: Nose normal.     Mouth/Throat:     Mouth: Mucous membranes are moist.     Pharynx: Oropharynx is clear.  Eyes:     Extraocular Movements: Extraocular movements intact.     Conjunctiva/sclera: Conjunctivae normal.     Pupils: Pupils are equal, round, and reactive to light.  Cardiovascular:     Rate and Rhythm: Normal rate and regular rhythm.  Pulmonary:     Effort: Pulmonary effort is normal.     Breath sounds: Normal breath sounds. No wheezing.  Abdominal:     General: Abdomen is flat. Bowel sounds are normal.     Palpations: Abdomen is soft.     Tenderness: There is no abdominal tenderness.  Musculoskeletal:     Cervical back: Normal range of motion and neck supple. No tenderness.     Comments: No overlying  skin changes patient right ankle.  Patient has decreased range of motion actively secondary to pain.  Passively the patient does have full range of motion of her left ankle.  2+ DP pulse.  Vascularly intact.  No obvious deformity.  Skin:    Capillary Refill: Capillary refill takes less than 2 seconds.  Neurological:     Mental Status: She is alert and oriented to person, place, and time.     ED Results / Procedures / Treatments   Labs (all labs ordered are listed, but only abnormal results are displayed) Labs Reviewed - No data to display  EKG None  Radiology DG Ankle Complete Right  Result Date: 08/15/2022 CLINICAL DATA:  Right ankle pain and swelling EXAM: RIGHT ANKLE - COMPLETE 3 VIEW COMPARISON:  None Available. FINDINGS:  There is no evidence of fracture, dislocation, or joint effusion. No evidence of arthropathy or other focal bone abnormality. Soft tissue swelling of the lateral malleolus. IMPRESSION: No acute fracture or dislocation. Soft tissue swelling of the lateral malleolus. Electronically Signed   By: Yetta Glassman M.D.   On: 08/15/2022 15:21    Procedures Procedures   Medications Ordered in ED Medications - No data to display  ED Course/ Medical Decision Making/ A&P                           Medical Decision Making Amount and/or Complexity of Data Reviewed Radiology: ordered.   71 year old female presents to ED for evaluation.  Please see HPI for further details.  On my examination patient afebrile nontachycardic.  Patient lung sounds clear bilaterally.  Patient has decreased range of motion actively to the right ankle secondary to pain.  There is no overlying skin change, deformity.  The patient has a 2+ DP pulse.  The patient is neurovascularly intact.  Patient able to ambulate on right foot without difficulty.  Plain film imaging of patient right ankle shows lateral soft tissue swelling however no acute fracture or deformity.  The patient will be placed in Ace wrap and advised to follow-up with her PCP.  Patient counseled on RICE.  Patient had all of her questions answered to her satisfaction.  The patient stable at this time for discharge home.  Final Clinical Impression(s) / ED Diagnoses Final diagnoses:  Sprain of anterior talofibular ligament of right ankle, initial encounter    Rx / DC Orders ED Discharge Orders     None         Azucena Cecil, PA-C 08/15/22 1536    Pattricia Boss, MD 08/16/22 1538

## 2022-08-15 NOTE — Discharge Instructions (Addendum)
Return to the ED with any new or worsening signs or symptoms Follow-up with PCP if symptoms fail to improve Please read attached concerning ankle sprains Please continue taking ibuprofen and Tylenol alternatingly for best pain relief Please utilize RICE.  Rest, ice, compress, elevate.

## 2022-08-15 NOTE — ED Triage Notes (Signed)
Patient here POV from Home.  Endorse Rolling her Right Ankle while at work 2 Hours ago. Swelling noted.   NAD noted during Triage. A&Ox4. GCS 15. Ambulatory.

## 2022-08-20 ENCOUNTER — Telehealth: Payer: Self-pay | Admitting: *Deleted

## 2022-08-20 NOTE — Telephone Encounter (Signed)
        Patient  visited [facility] ed on 08/15/2022  for fall   Telephone encounter attempt :  1st  A HIPAA compliant voice message was left requesting a return call.  Instructed patient to call back at [phone].  [facility] [phone] [address] , [place] [address] Email : [email or web address]

## 2022-08-22 DIAGNOSIS — R3129 Other microscopic hematuria: Secondary | ICD-10-CM | POA: Diagnosis not present

## 2022-08-22 DIAGNOSIS — Z9049 Acquired absence of other specified parts of digestive tract: Secondary | ICD-10-CM | POA: Diagnosis not present

## 2022-08-22 DIAGNOSIS — Z90710 Acquired absence of both cervix and uterus: Secondary | ICD-10-CM | POA: Diagnosis not present

## 2022-08-22 DIAGNOSIS — I700 Atherosclerosis of aorta: Secondary | ICD-10-CM | POA: Diagnosis not present

## 2022-08-22 DIAGNOSIS — R350 Frequency of micturition: Secondary | ICD-10-CM | POA: Diagnosis not present

## 2022-08-22 DIAGNOSIS — R3915 Urgency of urination: Secondary | ICD-10-CM | POA: Diagnosis not present

## 2022-08-22 DIAGNOSIS — R3121 Asymptomatic microscopic hematuria: Secondary | ICD-10-CM | POA: Diagnosis not present

## 2022-08-27 ENCOUNTER — Ambulatory Visit (HOSPITAL_COMMUNITY)
Admission: RE | Admit: 2022-08-27 | Discharge: 2022-08-27 | Disposition: A | Discharge status: Home / Self Care | Payer: Medicare Other | Source: Ambulatory Visit | Attending: Registered Nurse | Admitting: Registered Nurse

## 2022-08-27 DIAGNOSIS — Z8249 Family history of ischemic heart disease and other diseases of the circulatory system: Secondary | ICD-10-CM | POA: Insufficient documentation

## 2022-09-14 DIAGNOSIS — Z6820 Body mass index (BMI) 20.0-20.9, adult: Secondary | ICD-10-CM | POA: Diagnosis not present

## 2022-09-14 DIAGNOSIS — H6123 Impacted cerumen, bilateral: Secondary | ICD-10-CM | POA: Diagnosis not present

## 2022-09-14 DIAGNOSIS — H60392 Other infective otitis externa, left ear: Secondary | ICD-10-CM | POA: Diagnosis not present

## 2022-10-29 ENCOUNTER — Ambulatory Visit
Admission: RE | Admit: 2022-10-29 | Discharge: 2022-10-29 | Disposition: A | Discharge status: Home / Self Care | Payer: Medicare Other | Source: Ambulatory Visit | Attending: Registered Nurse | Admitting: Registered Nurse

## 2022-10-29 DIAGNOSIS — Z1231 Encounter for screening mammogram for malignant neoplasm of breast: Secondary | ICD-10-CM

## 2022-11-12 DIAGNOSIS — E559 Vitamin D deficiency, unspecified: Secondary | ICD-10-CM | POA: Diagnosis not present

## 2022-11-12 DIAGNOSIS — E7800 Pure hypercholesterolemia, unspecified: Secondary | ICD-10-CM | POA: Diagnosis not present

## 2022-11-22 DIAGNOSIS — H02834 Dermatochalasis of left upper eyelid: Secondary | ICD-10-CM | POA: Diagnosis not present

## 2022-11-22 DIAGNOSIS — H02831 Dermatochalasis of right upper eyelid: Secondary | ICD-10-CM | POA: Diagnosis not present

## 2022-11-22 DIAGNOSIS — H2513 Age-related nuclear cataract, bilateral: Secondary | ICD-10-CM | POA: Diagnosis not present

## 2022-11-22 DIAGNOSIS — H43813 Vitreous degeneration, bilateral: Secondary | ICD-10-CM | POA: Diagnosis not present

## 2022-11-22 DIAGNOSIS — H52203 Unspecified astigmatism, bilateral: Secondary | ICD-10-CM | POA: Diagnosis not present

## 2022-11-28 DIAGNOSIS — R030 Elevated blood-pressure reading, without diagnosis of hypertension: Secondary | ICD-10-CM | POA: Diagnosis not present

## 2022-11-28 DIAGNOSIS — H60392 Other infective otitis externa, left ear: Secondary | ICD-10-CM | POA: Diagnosis not present

## 2022-11-28 DIAGNOSIS — Z6820 Body mass index (BMI) 20.0-20.9, adult: Secondary | ICD-10-CM | POA: Diagnosis not present

## 2022-12-10 DIAGNOSIS — D1801 Hemangioma of skin and subcutaneous tissue: Secondary | ICD-10-CM | POA: Diagnosis not present

## 2022-12-10 DIAGNOSIS — D485 Neoplasm of uncertain behavior of skin: Secondary | ICD-10-CM | POA: Diagnosis not present

## 2022-12-10 DIAGNOSIS — D2271 Melanocytic nevi of right lower limb, including hip: Secondary | ICD-10-CM | POA: Diagnosis not present

## 2022-12-10 DIAGNOSIS — L820 Inflamed seborrheic keratosis: Secondary | ICD-10-CM | POA: Diagnosis not present

## 2022-12-10 DIAGNOSIS — L814 Other melanin hyperpigmentation: Secondary | ICD-10-CM | POA: Diagnosis not present

## 2022-12-10 DIAGNOSIS — Z85828 Personal history of other malignant neoplasm of skin: Secondary | ICD-10-CM | POA: Diagnosis not present

## 2022-12-10 DIAGNOSIS — L821 Other seborrheic keratosis: Secondary | ICD-10-CM | POA: Diagnosis not present

## 2022-12-10 DIAGNOSIS — D225 Melanocytic nevi of trunk: Secondary | ICD-10-CM | POA: Diagnosis not present

## 2022-12-10 DIAGNOSIS — L570 Actinic keratosis: Secondary | ICD-10-CM | POA: Diagnosis not present

## 2022-12-21 DIAGNOSIS — Z6820 Body mass index (BMI) 20.0-20.9, adult: Secondary | ICD-10-CM | POA: Diagnosis not present

## 2022-12-21 DIAGNOSIS — H6122 Impacted cerumen, left ear: Secondary | ICD-10-CM | POA: Diagnosis not present

## 2023-03-29 DIAGNOSIS — Z6820 Body mass index (BMI) 20.0-20.9, adult: Secondary | ICD-10-CM | POA: Diagnosis not present

## 2023-03-29 DIAGNOSIS — H6122 Impacted cerumen, left ear: Secondary | ICD-10-CM | POA: Diagnosis not present

## 2023-04-01 DIAGNOSIS — H6121 Impacted cerumen, right ear: Secondary | ICD-10-CM | POA: Diagnosis not present

## 2023-04-01 DIAGNOSIS — Z6820 Body mass index (BMI) 20.0-20.9, adult: Secondary | ICD-10-CM | POA: Diagnosis not present

## 2023-04-16 DIAGNOSIS — M7541 Impingement syndrome of right shoulder: Secondary | ICD-10-CM | POA: Diagnosis not present

## 2023-04-16 DIAGNOSIS — M7542 Impingement syndrome of left shoulder: Secondary | ICD-10-CM | POA: Diagnosis not present

## 2023-04-24 DIAGNOSIS — S46012D Strain of muscle(s) and tendon(s) of the rotator cuff of left shoulder, subsequent encounter: Secondary | ICD-10-CM | POA: Diagnosis not present

## 2023-04-30 DIAGNOSIS — S46012D Strain of muscle(s) and tendon(s) of the rotator cuff of left shoulder, subsequent encounter: Secondary | ICD-10-CM | POA: Diagnosis not present

## 2023-05-07 DIAGNOSIS — S46012D Strain of muscle(s) and tendon(s) of the rotator cuff of left shoulder, subsequent encounter: Secondary | ICD-10-CM | POA: Diagnosis not present

## 2023-05-12 DIAGNOSIS — S46012D Strain of muscle(s) and tendon(s) of the rotator cuff of left shoulder, subsequent encounter: Secondary | ICD-10-CM | POA: Diagnosis not present

## 2023-05-19 DIAGNOSIS — M7542 Impingement syndrome of left shoulder: Secondary | ICD-10-CM | POA: Diagnosis not present

## 2023-06-02 DIAGNOSIS — S46012D Strain of muscle(s) and tendon(s) of the rotator cuff of left shoulder, subsequent encounter: Secondary | ICD-10-CM | POA: Diagnosis not present

## 2023-06-03 DIAGNOSIS — H6123 Impacted cerumen, bilateral: Secondary | ICD-10-CM | POA: Diagnosis not present

## 2023-06-03 DIAGNOSIS — Z6820 Body mass index (BMI) 20.0-20.9, adult: Secondary | ICD-10-CM | POA: Diagnosis not present

## 2023-06-12 DIAGNOSIS — Z85828 Personal history of other malignant neoplasm of skin: Secondary | ICD-10-CM | POA: Diagnosis not present

## 2023-06-12 DIAGNOSIS — C44212 Basal cell carcinoma of skin of right ear and external auricular canal: Secondary | ICD-10-CM | POA: Diagnosis not present

## 2023-08-08 DIAGNOSIS — E7800 Pure hypercholesterolemia, unspecified: Secondary | ICD-10-CM | POA: Diagnosis not present

## 2023-08-08 DIAGNOSIS — E559 Vitamin D deficiency, unspecified: Secondary | ICD-10-CM | POA: Diagnosis not present

## 2023-08-08 DIAGNOSIS — Z0000 Encounter for general adult medical examination without abnormal findings: Secondary | ICD-10-CM | POA: Diagnosis not present

## 2023-08-18 DIAGNOSIS — M8589 Other specified disorders of bone density and structure, multiple sites: Secondary | ICD-10-CM | POA: Diagnosis not present

## 2023-08-18 DIAGNOSIS — Z780 Asymptomatic menopausal state: Secondary | ICD-10-CM | POA: Diagnosis not present

## 2023-08-18 DIAGNOSIS — Z0000 Encounter for general adult medical examination without abnormal findings: Secondary | ICD-10-CM | POA: Diagnosis not present

## 2023-08-18 DIAGNOSIS — Z23 Encounter for immunization: Secondary | ICD-10-CM | POA: Diagnosis not present

## 2023-08-18 DIAGNOSIS — M8580 Other specified disorders of bone density and structure, unspecified site: Secondary | ICD-10-CM | POA: Diagnosis not present

## 2023-08-18 DIAGNOSIS — E559 Vitamin D deficiency, unspecified: Secondary | ICD-10-CM | POA: Diagnosis not present

## 2023-08-18 DIAGNOSIS — Z1212 Encounter for screening for malignant neoplasm of rectum: Secondary | ICD-10-CM | POA: Diagnosis not present

## 2023-08-26 ENCOUNTER — Other Ambulatory Visit: Payer: Self-pay | Admitting: Registered Nurse

## 2023-08-26 DIAGNOSIS — Z6820 Body mass index (BMI) 20.0-20.9, adult: Secondary | ICD-10-CM | POA: Diagnosis not present

## 2023-08-26 DIAGNOSIS — H6122 Impacted cerumen, left ear: Secondary | ICD-10-CM | POA: Diagnosis not present

## 2023-08-26 DIAGNOSIS — Z1231 Encounter for screening mammogram for malignant neoplasm of breast: Secondary | ICD-10-CM

## 2023-09-02 ENCOUNTER — Emergency Department (HOSPITAL_COMMUNITY): Payer: Medicare Other

## 2023-09-02 ENCOUNTER — Other Ambulatory Visit: Payer: Self-pay

## 2023-09-02 ENCOUNTER — Emergency Department (HOSPITAL_COMMUNITY)
Admission: EM | Admit: 2023-09-02 | Discharge: 2023-09-02 | Disposition: A | Discharge status: Home / Self Care | Payer: Medicare Other | Attending: Emergency Medicine | Admitting: Emergency Medicine

## 2023-09-02 ENCOUNTER — Encounter (HOSPITAL_COMMUNITY): Payer: Self-pay | Admitting: Emergency Medicine

## 2023-09-02 DIAGNOSIS — Z90710 Acquired absence of both cervix and uterus: Secondary | ICD-10-CM | POA: Diagnosis not present

## 2023-09-02 DIAGNOSIS — Z9049 Acquired absence of other specified parts of digestive tract: Secondary | ICD-10-CM | POA: Diagnosis not present

## 2023-09-02 DIAGNOSIS — R1013 Epigastric pain: Secondary | ICD-10-CM | POA: Diagnosis not present

## 2023-09-02 LAB — COMPREHENSIVE METABOLIC PANEL
ALT: 21 U/L (ref 0–44)
AST: 23 U/L (ref 15–41)
Albumin: 4.2 g/dL (ref 3.5–5.0)
Alkaline Phosphatase: 68 U/L (ref 38–126)
Anion gap: 8 (ref 5–15)
BUN: 15 mg/dL (ref 8–23)
CO2: 26 mmol/L (ref 22–32)
Calcium: 8.8 mg/dL — ABNORMAL LOW (ref 8.9–10.3)
Chloride: 106 mmol/L (ref 98–111)
Creatinine, Ser: 0.73 mg/dL (ref 0.44–1.00)
GFR, Estimated: >60 mL/min (ref >60)
Glucose, Bld: 105 mg/dL — ABNORMAL HIGH (ref 70–99)
Potassium: 3.9 mmol/L (ref 3.5–5.1)
Sodium: 140 mmol/L (ref 135–145)
Total Bilirubin: 2.3 mg/dL — ABNORMAL HIGH (ref <1.2)
Total Protein: 7 g/dL (ref 6.5–8.1)

## 2023-09-02 LAB — TROPONIN I (HIGH SENSITIVITY): Troponin I (High Sensitivity): <2 ng/L (ref <18)

## 2023-09-02 LAB — CBC WITH DIFFERENTIAL/PLATELET
Abs Immature Granulocytes: 0 10*3/uL (ref 0.00–0.07)
Basophils Absolute: 0 10*3/uL (ref 0.0–0.1)
Basophils Relative: 1 %
Eosinophils Absolute: 0.1 10*3/uL (ref 0.0–0.5)
Eosinophils Relative: 1 %
HCT: 43 % (ref 36.0–46.0)
Hemoglobin: 13.7 g/dL (ref 12.0–15.0)
Immature Granulocytes: 0 %
Lymphocytes Relative: 22 %
Lymphs Abs: 1.1 10*3/uL (ref 0.7–4.0)
MCH: 32.1 pg (ref 26.0–34.0)
MCHC: 31.9 g/dL (ref 30.0–36.0)
MCV: 100.7 fL — ABNORMAL HIGH (ref 80.0–100.0)
Monocytes Absolute: 0.5 10*3/uL (ref 0.1–1.0)
Monocytes Relative: 11 %
Neutro Abs: 3.4 10*3/uL (ref 1.7–7.7)
Neutrophils Relative %: 65 %
Platelets: 261 10*3/uL (ref 150–400)
RBC: 4.27 MIL/uL (ref 3.87–5.11)
RDW: 12 % (ref 11.5–15.5)
WBC: 5.1 10*3/uL (ref 4.0–10.5)
nRBC: 0 % (ref 0.0–0.2)

## 2023-09-02 LAB — LIPASE, BLOOD: Lipase: 30 U/L (ref 11–51)

## 2023-09-02 MED ORDER — SODIUM CHLORIDE 0.9 % IV BOLUS
1000.0000 mL | Freq: Once | INTRAVENOUS | Status: AC
  Administered 2023-09-02: 1000 mL via INTRAVENOUS

## 2023-09-02 MED ORDER — ACETAMINOPHEN 500 MG PO TABS
1000.0000 mg | ORAL_TABLET | Freq: Once | ORAL | Status: AC
  Administered 2023-09-02: 1000 mg via ORAL
  Filled 2023-09-02: qty 2

## 2023-09-02 MED ORDER — ALUM & MAG HYDROXIDE-SIMETH 200-200-20 MG/5ML PO SUSP
30.0000 mL | Freq: Once | ORAL | Status: AC
  Administered 2023-09-02: 30 mL via ORAL
  Filled 2023-09-02: qty 30

## 2023-09-02 MED ORDER — IOHEXOL 300 MG/ML  SOLN
100.0000 mL | Freq: Once | INTRAMUSCULAR | Status: AC | PRN
  Administered 2023-09-02: 100 mL via INTRAVENOUS

## 2023-09-02 NOTE — ED Provider Notes (Addendum)
[facility] EMERGENC[facility] DEPARTMENT AT [facility] Provider Note   CSN: [id number] Arrival date & time: 09/02/23  0815     History  Chief Complaint  Patient presents with   Abdominal Pain    Maria Willis is a 72 y.o. female.  72 yo F with a cc of epigastric discomfort.  Going on for a couple days.  No fevers, no vomiting.  + constipation.  No exertional symptoms.  She is worried because things have not improved and she is scheduled to go on a family vacation in a couple days.  She was worried that maybe she had a bowel obstruction or perhaps hiatal hernia after researching her symptoms.   Abdominal Pain      Home Medications Prior to Admission medications   Medication Sig Start Date End Date Taking? Authorizing Provider  cholecalciferol (VITAMIN D3) 25 MCG (1000 UNIT) tablet Take 1,000 Units by mouth every Sunday.    [provider]  ibuprofen (ADVIL) 200 MG tablet Take 200 mg by mouth every 6 (six) hours as needed for headache or moderate pain.    [provider]  LECITHIN CONCENTRATE PO Take 1,200 mg by mouth daily.    [provider]  NON FORMULARY Take 1 tablet by mouth daily. Glycoberine k-127    [provider]  NON FORMULARY Take 2 capsules by mouth daily. CholerReqII    [provider]  OVER THE COUNTER MEDICATION Take 2 tablets by mouth daily. guggul lipid    [provider]  oxyCODONE-acetaminophen (PERCOCET) 5-325 MG tablet Take 1 tablet by mouth every 6 (six) hours as needed for severe pain. 11/23/20   Sherrilyn Rist, MD      Allergies    Codeine sulfate, Drug ingredient [monosodium glutamate], Sulfa antibiotics, Sulfonamide derivatives, Amoxicillin, and Neosporin [neomycin-bacitracin zn-polymyx]    Review of Systems   Review of Systems  Gastrointestinal:  Positive for abdominal pain.    Physical Exam Updated Vital Signs BP (!) 150/68 (BP Location: Left Arm)   Pulse 77   Temp 98 F (36.7 C)  (Oral)   Resp 17   Ht 5\' 5"  (1.651 m)   Wt 55.3 kg   SpO2 96%   BMI 20.30 kg/m  Physical Exam Vitals and nursing note reviewed.  Constitutional:      General: She is not in acute distress.    Appearance: She is well-developed. She is not diaphoretic.  HENT:     Head: Normocephalic and atraumatic.  Eyes:     Pupils: Pupils are equal, round, and reactive to light.  Cardiovascular:     Rate and Rhythm: Normal rate and regular rhythm.     Heart sounds: No murmur heard.    No friction rub. No gallop.  Pulmonary:     Effort: Pulmonary effort is normal.     Breath sounds: No wheezing or rales.  Abdominal:     General: There is no distension.     Palpations: Abdomen is soft.     Tenderness: There is no abdominal tenderness.     Comments: Benign abdominal exam  Musculoskeletal:        General: No tenderness.     Cervical back: Normal range of motion and neck supple.  Skin:    General: Skin is warm and dry.  Neurological:     Mental Status: She is alert and oriented to person, place, and time.  Psychiatric:        Behavior: Behavior normal.  ED Results / Procedures / Treatments   Labs (all labs ordered are listed, but only abnormal results are displayed) Labs Reviewed  CBC WITH DIFFERENTIAL/PLATELET - Abnormal; Notable for the following components:      Result Value   MCV 100.7 (*)    All other components within normal limits  COMPREHENSIVE METABOLIC PANEL - Abnormal; Notable for the following components:   Glucose, Bld 105 (*)    Calcium 8.8 (*)    Total Bilirubin 2.3 (*)    All other components within normal limits  LIPASE, BLOOD  TROPONIN I (HIGH SENSITIVITY)    EKG EKG Interpretation Date/Time:  Tuesday September 02 2023 08:53:06 EST Ventricular Rate:  81 PR Interval:  135 QRS Duration:  91 QT Interval:  389 QTC Calculation: 452 R Axis:   49  Text Interpretation: Sinus rhythm No old tracing to compare Confirmed by Melene Plan [phone]) on 09/02/2023  10:00:35 AM  Radiology No results found.  Procedures Procedures    Medications Ordered in ED Medications  alum & mag hydroxide-simeth (MAALOX/MYLANTA) 200-200-20 MG/5ML suspension 30 mL (30 mLs Oral Given 09/02/23 0851)  acetaminophen (TYLENOL) tablet 1,000 mg (1,000 mg Oral Given 09/02/23 0851)  sodium chloride 0.9 % bolus 1,000 mL (1,000 mLs Intravenous New Bag/Given 09/02/23 0936)  iohexol (OMNIPAQUE) 300 MG/ML solution 100 mL (100 mLs Intravenous Contrast Given 09/02/23 1018)    ED Course/ Medical Decision Making/ A&P                                 Medical Decision Making Amount and/or Complexity of Data Reviewed Labs: ordered. Radiology: ordered. ECG/medicine tests: ordered.  Risk OTC drugs. Prescription drug management.   72 yo F with a cc of epigastric abdominal discomfort.  Going on for about 4 days now.  Seems to be worst after she eats in the morning.  Nothing seems to make it better.  She has not had symptoms like this before.  Not exertional.  Will obtain a laboratory evaluation.  CT imaging.  Reassess.  Patient is feeling quite a bit better after GI cocktail.  Troponins negative, LFTs and lipase are unremarkable.  No leukocytosis no acute anemia.  Patient had her CT scan obtained about 10:30 AM and it is now 3 PM and her results are not yet back.  She is unwilling to wait any longer.  I discussed having her check this on MyChart.  Encouraged her to call her doctor to set up a follow-up appointment.  Will do a trial of H2 blockers.  Trial of MiraLAX.  Right after the patient was discharged her results did come back.  No obvious acute findings per radiology.  I did get a chance to discuss this with the patient prior to discharge.  3:01 PM:  I have discussed the diagnosis/risks/treatment options with the patient.  Evaluation and diagnostic testing in the emergency department does not suggest an emergent condition requiring admission or immediate intervention  beyond what has been performed at this time.  They will follow up with PCP. We also discussed returning to the ED immediately if new or worsening sx occur. We discussed the sx which are most concerning (e.g., sudden worsening pain, fever, inability to tolerate by mouth) that necessitate immediate return. Medications administered to the patient during their visit and any new prescriptions provided to the patient are listed below.  Medications given during this visit Medications  alum & mag hydroxide-simeth (MAALOX/MYLANTA)  200-200-20 MG/5ML suspension 30 mL (30 mLs Oral Given 09/02/23 0851)  acetaminophen (TYLENOL) tablet 1,000 mg (1,000 mg Oral Given 09/02/23 0851)  sodium chloride 0.9 % bolus 1,000 mL (1,000 mLs Intravenous New Bag/Given 09/02/23 0936)  iohexol (OMNIPAQUE) 300 MG/ML solution 100 mL (100 mLs Intravenous Contrast Given 09/02/23 1018)     The patient appears reasonably screen and/or stabilized for discharge and I doubt any other medical condition or other [facility] requiring further screening, evaluation, or treatment in the ED at this time prior to discharge.          Final Clinical Impression(s) / ED Diagnoses Final diagnoses:  Epigastric abdominal pain    Rx / DC Orders ED Discharge Orders     None            Melene Plan, DO 09/02/23 1509

## 2023-09-02 NOTE — Discharge Instructions (Signed)
Try pepcid or tagamet up to twice a day.  Try to avoid things that may make this worse, most commonly these are spicy foods tomato based products fatty foods chocolate and peppermint.  Alcohol and tobacco can also make this worse.  Return to the emergency department for sudden worsening pain fever or inability to eat or drink.  You can either follow the instructions on the bottle of the MiraLAX.  It usually takes about 3 to 4 days to have a big bowel movement or you can take 8 scoops of miralax in 32oz of whatever you would like to drink.(Gatorade comes in this size) You can also use a fleets enema which you can buy over the counter at the pharmacy.  Return for worsening abdominal pain, vomiting or fever.  He can take up to 4000 mg of Tylenol a day.  Typically this is two extxrastrength tablets taken 4 times a day.

## 2023-09-02 NOTE — ED Triage Notes (Signed)
Pt arrived POV. C/o upper abd pain started 4 days ago. Denis N/V. Last BM 3 days ago. Previous hx of gallbladder removal.   A&O x4

## 2023-09-23 ENCOUNTER — Telehealth: Payer: Self-pay

## 2023-09-23 DIAGNOSIS — M810 Age-related osteoporosis without current pathological fracture: Secondary | ICD-10-CM | POA: Diagnosis not present

## 2023-09-23 DIAGNOSIS — R030 Elevated blood-pressure reading, without diagnosis of hypertension: Secondary | ICD-10-CM | POA: Diagnosis not present

## 2023-09-23 NOTE — Telephone Encounter (Signed)
Transition Care Management Unsuccessful Follow-up Telephone Call  Date of discharge and from where:  [facility]  Attempts:  1st Attempt  Reason for unsuccessful TCM follow-up call:  No answer/busy   [facility] [place]  [facility], [facility], Phone: [phone] Website: [email or web address]

## 2023-11-02 DIAGNOSIS — H6123 Impacted cerumen, bilateral: Secondary | ICD-10-CM | POA: Diagnosis not present

## 2023-11-02 DIAGNOSIS — Z6820 Body mass index (BMI) 20.0-20.9, adult: Secondary | ICD-10-CM | POA: Diagnosis not present

## 2023-11-05 ENCOUNTER — Ambulatory Visit: Payer: Medicare Other

## 2023-11-06 ENCOUNTER — Ambulatory Visit: Payer: Medicare Other

## 2023-11-19 ENCOUNTER — Ambulatory Visit
Admission: RE | Admit: 2023-11-19 | Discharge: 2023-11-19 | Discharge status: Home / Self Care | Payer: Medicare Other | Source: Ambulatory Visit | Attending: Registered Nurse | Admitting: Registered Nurse

## 2023-11-19 DIAGNOSIS — Z1231 Encounter for screening mammogram for malignant neoplasm of breast: Secondary | ICD-10-CM | POA: Diagnosis not present

## 2023-11-24 DIAGNOSIS — H52203 Unspecified astigmatism, bilateral: Secondary | ICD-10-CM | POA: Diagnosis not present

## 2023-11-24 DIAGNOSIS — H2513 Age-related nuclear cataract, bilateral: Secondary | ICD-10-CM | POA: Diagnosis not present

## 2023-11-24 DIAGNOSIS — H02831 Dermatochalasis of right upper eyelid: Secondary | ICD-10-CM | POA: Diagnosis not present

## 2023-11-24 DIAGNOSIS — H02834 Dermatochalasis of left upper eyelid: Secondary | ICD-10-CM | POA: Diagnosis not present

## 2023-12-18 DIAGNOSIS — L814 Other melanin hyperpigmentation: Secondary | ICD-10-CM | POA: Diagnosis not present

## 2023-12-18 DIAGNOSIS — D1801 Hemangioma of skin and subcutaneous tissue: Secondary | ICD-10-CM | POA: Diagnosis not present

## 2023-12-18 DIAGNOSIS — C44519 Basal cell carcinoma of skin of other part of trunk: Secondary | ICD-10-CM | POA: Diagnosis not present

## 2023-12-18 DIAGNOSIS — Z85828 Personal history of other malignant neoplasm of skin: Secondary | ICD-10-CM | POA: Diagnosis not present

## 2023-12-18 DIAGNOSIS — L430 Hypertrophic lichen planus: Secondary | ICD-10-CM | POA: Diagnosis not present

## 2023-12-18 DIAGNOSIS — L821 Other seborrheic keratosis: Secondary | ICD-10-CM | POA: Diagnosis not present

## 2023-12-18 DIAGNOSIS — D485 Neoplasm of uncertain behavior of skin: Secondary | ICD-10-CM | POA: Diagnosis not present

## 2023-12-18 DIAGNOSIS — L570 Actinic keratosis: Secondary | ICD-10-CM | POA: Diagnosis not present

## 2024-01-08 DIAGNOSIS — H6122 Impacted cerumen, left ear: Secondary | ICD-10-CM | POA: Diagnosis not present

## 2024-01-08 DIAGNOSIS — Z6820 Body mass index (BMI) 20.0-20.9, adult: Secondary | ICD-10-CM | POA: Diagnosis not present

## 2024-01-08 DIAGNOSIS — H6502 Acute serous otitis media, left ear: Secondary | ICD-10-CM | POA: Diagnosis not present

## 2024-02-18 DIAGNOSIS — M25552 Pain in left hip: Secondary | ICD-10-CM | POA: Diagnosis not present

## 2024-02-23 DIAGNOSIS — R262 Difficulty in walking, not elsewhere classified: Secondary | ICD-10-CM | POA: Diagnosis not present

## 2024-02-23 DIAGNOSIS — M25552 Pain in left hip: Secondary | ICD-10-CM | POA: Diagnosis not present

## 2024-03-21 DIAGNOSIS — Z6820 Body mass index (BMI) 20.0-20.9, adult: Secondary | ICD-10-CM | POA: Diagnosis not present

## 2024-03-21 DIAGNOSIS — H6123 Impacted cerumen, bilateral: Secondary | ICD-10-CM | POA: Diagnosis not present

## 2024-05-20 DIAGNOSIS — B379 Candidiasis, unspecified: Secondary | ICD-10-CM | POA: Diagnosis not present

## 2024-05-20 DIAGNOSIS — Z6820 Body mass index (BMI) 20.0-20.9, adult: Secondary | ICD-10-CM | POA: Diagnosis not present

## 2024-05-20 DIAGNOSIS — H60392 Other infective otitis externa, left ear: Secondary | ICD-10-CM | POA: Diagnosis not present

## 2024-05-20 DIAGNOSIS — T3695XA Adverse effect of unspecified systemic antibiotic, initial encounter: Secondary | ICD-10-CM | POA: Diagnosis not present

## 2024-06-03 DIAGNOSIS — M26609 Unspecified temporomandibular joint disorder, unspecified side: Secondary | ICD-10-CM | POA: Diagnosis not present

## 2024-06-03 DIAGNOSIS — H9202 Otalgia, left ear: Secondary | ICD-10-CM | POA: Diagnosis not present

## 2024-06-03 DIAGNOSIS — H6992 Unspecified Eustachian tube disorder, left ear: Secondary | ICD-10-CM | POA: Diagnosis not present

## 2024-07-05 DIAGNOSIS — H903 Sensorineural hearing loss, bilateral: Secondary | ICD-10-CM | POA: Diagnosis not present

## 2024-07-05 DIAGNOSIS — H9203 Otalgia, bilateral: Secondary | ICD-10-CM | POA: Diagnosis not present

## 2024-07-06 DIAGNOSIS — L820 Inflamed seborrheic keratosis: Secondary | ICD-10-CM | POA: Diagnosis not present

## 2024-07-06 DIAGNOSIS — D0462 Carcinoma in situ of skin of left upper limb, including shoulder: Secondary | ICD-10-CM | POA: Diagnosis not present

## 2024-07-06 DIAGNOSIS — Z85828 Personal history of other malignant neoplasm of skin: Secondary | ICD-10-CM | POA: Diagnosis not present

## 2024-08-11 DIAGNOSIS — E7800 Pure hypercholesterolemia, unspecified: Secondary | ICD-10-CM | POA: Diagnosis not present

## 2024-08-11 DIAGNOSIS — E559 Vitamin D deficiency, unspecified: Secondary | ICD-10-CM | POA: Diagnosis not present

## 2024-08-11 DIAGNOSIS — Z0000 Encounter for general adult medical examination without abnormal findings: Secondary | ICD-10-CM | POA: Diagnosis not present

## 2024-08-20 DIAGNOSIS — Z0000 Encounter for general adult medical examination without abnormal findings: Secondary | ICD-10-CM | POA: Diagnosis not present

## 2024-08-20 DIAGNOSIS — Z23 Encounter for immunization: Secondary | ICD-10-CM | POA: Diagnosis not present

## 2024-08-20 DIAGNOSIS — E559 Vitamin D deficiency, unspecified: Secondary | ICD-10-CM | POA: Diagnosis not present

## 2024-08-20 DIAGNOSIS — Z87442 Personal history of urinary calculi: Secondary | ICD-10-CM | POA: Diagnosis not present

## 2024-08-26 DIAGNOSIS — Z6821 Body mass index (BMI) 21.0-21.9, adult: Secondary | ICD-10-CM | POA: Diagnosis not present

## 2024-08-26 DIAGNOSIS — H6122 Impacted cerumen, left ear: Secondary | ICD-10-CM | POA: Diagnosis not present

## 2024-08-31 DIAGNOSIS — Z1212 Encounter for screening for malignant neoplasm of rectum: Secondary | ICD-10-CM | POA: Diagnosis not present

## 2024-08-31 DIAGNOSIS — Z1211 Encounter for screening for malignant neoplasm of colon: Secondary | ICD-10-CM | POA: Diagnosis not present

## 2024-09-27 DIAGNOSIS — J0140 Acute pansinusitis, unspecified: Secondary | ICD-10-CM | POA: Diagnosis not present

## 2024-09-27 DIAGNOSIS — R051 Acute cough: Secondary | ICD-10-CM | POA: Diagnosis not present

## 2024-09-27 DIAGNOSIS — Z6820 Body mass index (BMI) 20.0-20.9, adult: Secondary | ICD-10-CM | POA: Diagnosis not present

## 2024-10-19 ENCOUNTER — Other Ambulatory Visit: Payer: Self-pay | Admitting: Registered Nurse

## 2024-10-19 DIAGNOSIS — Z1231 Encounter for screening mammogram for malignant neoplasm of breast: Secondary | ICD-10-CM

## 2024-11-19 ENCOUNTER — Inpatient Hospital Stay: Admission: RE | Admit: 2024-11-19

## 2024-11-19 DIAGNOSIS — Z1231 Encounter for screening mammogram for malignant neoplasm of breast: Secondary | ICD-10-CM
